# Patient Record
Sex: Female | Born: 1950 | Race: White | Hispanic: No | Smoking: Former smoker
Health system: Southern US, Community
[De-identification: ages and names within clinical notes are randomized; demographics above are authoritative.]

## PROBLEM LIST (undated history)

## (undated) DIAGNOSIS — F32A Depression, unspecified: Secondary | ICD-10-CM

## (undated) DIAGNOSIS — D696 Thrombocytopenia, unspecified: Secondary | ICD-10-CM

## (undated) DIAGNOSIS — I10 Essential (primary) hypertension: Secondary | ICD-10-CM

## (undated) DIAGNOSIS — F329 Major depressive disorder, single episode, unspecified: Secondary | ICD-10-CM

## (undated) DIAGNOSIS — J449 Chronic obstructive pulmonary disease, unspecified: Secondary | ICD-10-CM

## (undated) DIAGNOSIS — T7840XA Allergy, unspecified, initial encounter: Secondary | ICD-10-CM

## (undated) HISTORY — DX: Depression, unspecified: F32.A

## (undated) HISTORY — DX: Essential (primary) hypertension: I10

## (undated) HISTORY — DX: Major depressive disorder, single episode, unspecified: F32.9

## (undated) HISTORY — DX: Allergy, unspecified, initial encounter: T78.40XA

## (undated) HISTORY — PX: CARPAL TUNNEL RELEASE: SHX101

---
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
## 2008-06-30 ENCOUNTER — Emergency Department: Payer: Self-pay | Admitting: Unknown Physician Specialty

## 2012-02-13 ENCOUNTER — Ambulatory Visit: Payer: Self-pay

## 2012-02-20 ENCOUNTER — Ambulatory Visit: Payer: Self-pay

## 2015-05-21 ENCOUNTER — Other Ambulatory Visit: Payer: Self-pay | Admitting: Internal Medicine

## 2015-05-21 DIAGNOSIS — Z1231 Encounter for screening mammogram for malignant neoplasm of breast: Secondary | ICD-10-CM

## 2015-06-01 ENCOUNTER — Ambulatory Visit
Admission: RE | Admit: 2015-06-01 | Discharge: 2015-06-01 | Disposition: A | Discharge status: Home / Self Care | Payer: BLUE CROSS/BLUE SHIELD | Source: Ambulatory Visit | Attending: Internal Medicine | Admitting: Internal Medicine

## 2015-06-01 DIAGNOSIS — Z1231 Encounter for screening mammogram for malignant neoplasm of breast: Secondary | ICD-10-CM | POA: Diagnosis not present

## 2015-06-19 ENCOUNTER — Inpatient Hospital Stay: Payer: BLUE CROSS/BLUE SHIELD

## 2015-06-19 ENCOUNTER — Inpatient Hospital Stay: Payer: BLUE CROSS/BLUE SHIELD | Attending: Oncology | Admitting: Oncology

## 2015-06-19 ENCOUNTER — Encounter: Payer: Self-pay | Admitting: Oncology

## 2015-06-19 VITALS — BP 122/81 | HR 76 | Temp 98.2°F | Ht 61.0 in | Wt 105.9 lb

## 2015-06-19 DIAGNOSIS — I959 Hypotension, unspecified: Secondary | ICD-10-CM | POA: Insufficient documentation

## 2015-06-19 DIAGNOSIS — Z87891 Personal history of nicotine dependence: Secondary | ICD-10-CM

## 2015-06-19 DIAGNOSIS — F329 Major depressive disorder, single episode, unspecified: Secondary | ICD-10-CM | POA: Insufficient documentation

## 2015-06-19 DIAGNOSIS — Z803 Family history of malignant neoplasm of breast: Secondary | ICD-10-CM | POA: Insufficient documentation

## 2015-06-19 DIAGNOSIS — Z79899 Other long term (current) drug therapy: Secondary | ICD-10-CM | POA: Diagnosis not present

## 2015-06-19 DIAGNOSIS — D693 Immune thrombocytopenic purpura: Secondary | ICD-10-CM | POA: Insufficient documentation

## 2015-06-19 DIAGNOSIS — I10 Essential (primary) hypertension: Secondary | ICD-10-CM | POA: Diagnosis not present

## 2015-06-19 LAB — IRON AND TIBC
IRON: 70 ug/dL (ref 28–170)
Saturation Ratios: 19 % (ref 10.4–31.8)
TIBC: 364 ug/dL (ref 250–450)
UIBC: 294 ug/dL

## 2015-06-19 LAB — APTT: aPTT: 29 seconds (ref 24–36)

## 2015-06-19 LAB — CBC
HCT: 43.8 % (ref 35.0–47.0)
HEMOGLOBIN: 14.4 g/dL (ref 12.0–16.0)
MCH: 30.7 pg (ref 26.0–34.0)
MCHC: 33 g/dL (ref 32.0–36.0)
MCV: 93.1 fL (ref 80.0–100.0)
Platelets: 30 10*3/uL — ABNORMAL LOW (ref 150–440)
RBC: 4.71 MIL/uL (ref 3.80–5.20)
RDW: 14 % (ref 11.5–14.5)
WBC: 9.5 10*3/uL (ref 3.6–11.0)

## 2015-06-19 LAB — VITAMIN B12: Vitamin B-12: 231 pg/mL (ref 180–914)

## 2015-06-19 LAB — FOLATE: Folate: 9.4 ng/mL (ref >5.9)

## 2015-06-19 LAB — FERRITIN: FERRITIN: 52 ng/mL (ref 11–307)

## 2015-06-19 LAB — LACTATE DEHYDROGENASE: LDH: 168 U/L (ref 98–192)

## 2015-06-20 LAB — PLATELET ANTIBODY PROFILE, SERUM
HLA AB SER QL EIA: NEGATIVE
IA/IIA Antibody: NEGATIVE
IB/IX Antibody: NEGATIVE
IIB/IIIA Antibody: NEGATIVE

## 2015-06-20 LAB — ENA+DNA/DS+SJORGEN'S
ENA SM Ab Ser-aCnc: <0.2 AI (ref 0.0–0.9)
RIBONUCLEIC PROTEIN: 1.2 AI — AB (ref 0.0–0.9)
SSA (Ro) (ENA) Antibody, IgG: <0.2 AI (ref 0.0–0.9)
SSB (La) (ENA) Antibody, IgG: <0.2 AI (ref 0.0–0.9)

## 2015-06-20 LAB — ANA W/REFLEX: ANA: POSITIVE — AB

## 2015-06-20 LAB — HAPTOGLOBIN: HAPTOGLOBIN: 92 mg/dL (ref 34–200)

## 2015-06-22 LAB — PROTEIN ELECTROPHORESIS, SERUM
A/G Ratio: 1.5 (ref 0.7–1.7)
ALPHA-1-GLOBULIN: 0.2 g/dL (ref 0.0–0.4)
ALPHA-2-GLOBULIN: 0.6 g/dL (ref 0.4–1.0)
Albumin ELP: 3.9 g/dL (ref 2.9–4.4)
Beta Globulin: 0.9 g/dL (ref 0.7–1.3)
GLOBULIN, TOTAL: 2.6 g/dL (ref 2.2–3.9)
Gamma Globulin: 0.9 g/dL (ref 0.4–1.8)
Total Protein ELP: 6.5 g/dL (ref 6.0–8.5)

## 2015-06-28 NOTE — Progress Notes (Signed)
[place]  Telephone:(336) [phone] [phone]) [phone]  ID: [name] OB: [birth year]  MR#: [id number]  HYW#:[id number]  No care team member to display  CHIEF COMPLAINT:  Chief Complaint  Patient presents with  . ITP  . 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 She currently feels well and is asymptomatic. She denies any easy bleeding or bruising. She has a good appetite and denies weight loss. She has no neurological point. She denies any recent fevers or illnesses. She has no chest pain or shortness of breath. She denies any nausea, vomiting, constipation, or diarrhea. She has no urinary complaints. Patient feels at her baseline and offers no specific complaints today.  REVIEW OF SYSTEMS:   Review of Systems  Constitutional: Negative.  Negative for fever, weight loss and malaise/fatigue.  Respiratory: Negative.  Negative for cough, hemoptysis and shortness of breath.   Cardiovascular: Negative.  Negative for chest pain.  Gastrointestinal: Negative.  Negative for abdominal pain and melena.  Genitourinary: Negative.  Negative for hematuria.  Musculoskeletal: Negative.   Neurological: Negative.  Negative for weakness.  Endo/Heme/Allergies: Does not bruise/bleed easily.  Psychiatric/Behavioral: Negative.     As per HPI. Otherwise, a complete review of systems is negatve.  PAST MEDICAL HISTORY: Past Medical History  Diagnosis Date  . Allergy   . Hypertension   . Depression     PAST SURGICAL HISTORY: Past Surgical History  Procedure Laterality Date  . Tonsillectomy Bilateral 1968    FAMILY HISTORY Family History  Problem Relation Age of Onset  . Breast cancer Mother     70's  . Breast cancer Daughter     43's   . Clotting disorder Daughter        ADVANCED DIRECTIVES:    HEALTH MAINTENANCE: Social History  Substance Use Topics  .  Smoking status: Former Smoker    Quit date: 04/19/2014  . Smokeless tobacco: None  . Alcohol Use: Yes     Colonoscopy:  PAP:  Bone density:  Lipid panel:  No Known Allergies  Current Outpatient Prescriptions  Medication Sig Dispense Refill  . cetirizine (ZYRTEC) 10 MG tablet Take 10 mg by mouth daily.    Marland Kitchen FLUoxetine (PROZAC) 40 MG capsule   2  . gabapentin (NEURONTIN) 400 MG capsule   2  . hydrochlorothiazide (HYDRODIURIL) 25 MG tablet   2  . traZODone (DESYREL) 50 MG tablet   2   No current facility-administered medications for this visit.    OBJECTIVE: Filed Vitals:   06/19/15 1056  BP: 122/81  Pulse: 76  Temp: 98.2 F (36.8 C)     Body mass index is 20.03 kg/(m^2).    ECOG FS:0 - Asymptomatic  General: Well-developed, well-nourished, no acute distress. Eyes: Pink conjunctiva, anicteric sclera. HEENT: Normocephalic, moist mucous membranes, clear oropharnyx. Lungs: Clear to auscultation bilaterally. Heart: Regular rate and rhythm. No rubs, murmurs, or gallops. Abdomen: Soft, nontender, nondistended. No organomegaly noted, normoactive bowel sounds. Musculoskeletal: No edema, cyanosis, or clubbing. Neuro: Alert, answering all questions appropriately. Cranial nerves grossly intact. Skin: No rashes or petechiae noted. Psych: Normal affect. Lymphatics: No cervical, calvicular, axillary or inguinal LAD.   LAB RESULTS:  No results found for: NA, K, CL, CO2, GLUCOSE, BUN, CREATININE, CALCIUM, PROT, ALBUMIN, AST, ALT, ALKPHOS, BILITOT, GFRNONAA, GFRAA  Lab Results  Component Value Date   WBC 9.5 06/19/2015   HGB 14.4 06/19/2015  HCT 43.8 06/19/2015   MCV 93.1 06/19/2015   PLT 30* 06/19/2015     STUDIES: Mm Digital Screening Bilateral  06/01/2015  CLINICAL DATA:  Screening. EXAM: DIGITAL SCREENING BILATERAL MAMMOGRAM WITH CAD COMPARISON:  None. ACR Breast Density Category b: There are scattered areas of fibroglandular density. FINDINGS: There are no findings  suspicious for malignancy. Images were processed with CAD. IMPRESSION: No mammographic evidence of malignancy. A result letter of this screening mammogram will be mailed directly to the patient. RECOMMENDATION: Screening mammogram in one year. (Code:SM-B-01Y) BI-RADS CATEGORY  1: Negative. Electronically Signed   By: Nolon Nations M.D.   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  PLAN:    1. ITP: All of patient's laboratory work today other than her platelets are within normal limits. Patient also reports a normal bone marrow biopsy in the past. We will consider repeating bone marrow biopsy to confirm diagnosis. Patient's white count is 30 and she will possibly benefit from treatment in the near future. Return to clinic in 3 weeks with repeat laboratory work and further evaluation. 2. Family history breast cancer: Prior 48, patient's mother had breast cancer in her 53s and her daughter had breast cancer in her 57s. We will consider genetic testing in the future.  Approximately 45 minutes was spent in discussion of which greater than 50% was consultation.  Patient expressed understanding and was in agreement with this plan. She also understands that She can call clinic at any time with any questions, concerns, or complaints.    Lloyd Huger, MD   06/28/2015 9:07 AM

## 2015-07-05 ENCOUNTER — Encounter: Payer: Self-pay | Admitting: Medical Oncology

## 2015-07-05 ENCOUNTER — Emergency Department: Payer: BLUE CROSS/BLUE SHIELD

## 2015-07-05 ENCOUNTER — Emergency Department
Admission: EM | Admit: 2015-07-05 | Discharge: 2015-07-05 | Disposition: A | Discharge status: Home / Self Care | Payer: BLUE CROSS/BLUE SHIELD | Attending: Emergency Medicine | Admitting: Emergency Medicine

## 2015-07-05 DIAGNOSIS — S8261XA Displaced fracture of lateral malleolus of right fibula, initial encounter for closed fracture: Secondary | ICD-10-CM | POA: Diagnosis not present

## 2015-07-05 DIAGNOSIS — Z87891 Personal history of nicotine dependence: Secondary | ICD-10-CM | POA: Diagnosis not present

## 2015-07-05 DIAGNOSIS — W010XXA Fall on same level from slipping, tripping and stumbling without subsequent striking against object, initial encounter: Secondary | ICD-10-CM | POA: Diagnosis not present

## 2015-07-05 DIAGNOSIS — S8254XA Nondisplaced fracture of medial malleolus of right tibia, initial encounter for closed fracture: Secondary | ICD-10-CM | POA: Diagnosis not present

## 2015-07-05 DIAGNOSIS — Y939 Activity, unspecified: Secondary | ICD-10-CM | POA: Diagnosis not present

## 2015-07-05 DIAGNOSIS — Y999 Unspecified external cause status: Secondary | ICD-10-CM | POA: Diagnosis not present

## 2015-07-05 DIAGNOSIS — X501XXA Overexertion from prolonged static or awkward postures, initial encounter: Secondary | ICD-10-CM | POA: Insufficient documentation

## 2015-07-05 DIAGNOSIS — Z79899 Other long term (current) drug therapy: Secondary | ICD-10-CM | POA: Diagnosis not present

## 2015-07-05 DIAGNOSIS — I10 Essential (primary) hypertension: Secondary | ICD-10-CM | POA: Insufficient documentation

## 2015-07-05 DIAGNOSIS — F329 Major depressive disorder, single episode, unspecified: Secondary | ICD-10-CM | POA: Insufficient documentation

## 2015-07-05 DIAGNOSIS — S8251XA Displaced fracture of medial malleolus of right tibia, initial encounter for closed fracture: Secondary | ICD-10-CM

## 2015-07-05 DIAGNOSIS — Y929 Unspecified place or not applicable: Secondary | ICD-10-CM | POA: Insufficient documentation

## 2015-07-05 DIAGNOSIS — S8991XA Unspecified injury of right lower leg, initial encounter: Secondary | ICD-10-CM | POA: Diagnosis present

## 2015-07-05 MED ORDER — HYDROCODONE-ACETAMINOPHEN 5-325 MG PO TABS: 1.0000 | ORAL_TABLET | Freq: Four times a day (QID) | ORAL | Status: DC | PRN

## 2015-07-05 MED ORDER — OXYCODONE-ACETAMINOPHEN 5-325 MG PO TABS
1.0000 | ORAL_TABLET | Freq: Once | ORAL | Status: AC
  Administered 2015-07-05: 1 via ORAL
  Filled 2015-07-05: qty 1

## 2015-07-05 NOTE — ED Notes (Signed)
See triage note  States she tripped over cat   Twisted right ankle  Unable to bear full wt.

## 2015-07-05 NOTE — ED Provider Notes (Signed)
[facility] Emergency Department Provider Note  ____________________________________________  Time seen: Approximately 11:33 AM  I have reviewed the triage vital signs and the nursing notes.   HISTORY  Chief Complaint Ankle Pain    HPI Nancy Love is a 65 y.o. female , NAD, presents to the emergency department with right ankle pain after tripping over a cat earlier today. Patient states she has a rescue cat that stays around her feet. Notes that she tripped over the animal early this morning and twisted her right ankle. Has been unable to bear weight about the right lower extremity since that time. Has been using a walker to assist with ambulation. Has noted some swelling and severe pain. Has, weakness, tingling. No previous injuries to her right lower extremity.Denies any open wounds or lacerations. Denies head injury, LOC, dizziness, visual changes. Has had no chest pain, abdominal pain, shortness breath, nausea, vomiting.   Past Medical History  Diagnosis Date  . Allergy   . Hypertension   . Depression     There are no active problems to display for this patient.   Past Surgical History  Procedure Laterality Date  . Tonsillectomy Bilateral 1968    Current Outpatient Rx  Name  Route  Sig  Dispense  Refill  . cetirizine (ZYRTEC) 10 MG tablet   Oral   Take 10 mg by mouth daily.         Marland Kitchen FLUoxetine (PROZAC) 40 MG capsule            2   . gabapentin (NEURONTIN) 400 MG capsule            2   . hydrochlorothiazide (HYDRODIURIL) 25 MG tablet            2   . HYDROcodone-acetaminophen (NORCO) 5-325 MG tablet   Oral   Take 1 tablet by mouth every 6 (six) hours as needed for severe pain.   12 tablet   0   . traZODone (DESYREL) 50 MG tablet            2     Allergies Review of patient's allergies indicates no known allergies.  Family History  Problem Relation Age of Onset  . Breast cancer Mother     34's  . Breast  cancer Daughter     10's   . Clotting disorder Daughter     Social History Social History  Substance Use Topics  . Smoking status: Former Smoker    Quit date: 04/19/2014  . Smokeless tobacco: None  . Alcohol Use: Yes     Review of Systems  Constitutional: No fatigue Eyes: No visual changes.  Cardiovascular: No chest pain. Respiratory:  No shortness of breath. No wheezing.  Gastrointestinal: No abdominal pain.  No nausea, vomiting.  Musculoskeletal: Positive right ankle pain. Skin: Positive swelling and bruising right ankle. Negative for rash, open wounds, lacerations. Neurological: Negative for headaches, focal weakness or numbness. 10-point ROS otherwise negative.  ____________________________________________   PHYSICAL EXAM:  VITAL SIGNS: ED Triage Vitals  Enc Vitals Group     BP 07/05/15 1103 106/66 mmHg     Pulse Rate 07/05/15 1103 88     Resp 07/05/15 1103 17     Temp 07/05/15 1103 98.1 F (36.7 C)     Temp Source 07/05/15 1103 Oral     SpO2 07/05/15 1103 96 %     Weight 07/05/15 1059 105 lb (47.628 kg)     Height --  Head Cir --      Peak Flow --      Pain Score 07/05/15 1059 10     Pain Loc --      Pain Edu? --      Excl. in [place]? --      Constitutional: Alert and oriented. Well appearing and in no acute distress, but in pain. Eyes: Conjunctivae are normal.  Head: Atraumatic. Cardiovascular: Good peripheral circulation with 2+ pulses noted in the right lower extremity. Capillary refill is brisk in the right lower extremity. Respiratory: Normal respiratory effort without tachypnea or retractions.  Musculoskeletal: Patient unable to flex the right foot due to pain in the right ankle. Significant tenderness to palpation about the right lateral malleolus and minimal pain about the right medial malleolus. No tenderness to palpation about the right foot or toes. Full range of motion of all digits of the right foot. No lower extremity edema.  No joint  effusions. Neurologic:  Normal speech and language. No gross focal neurologic deficits are appreciated.  Skin:  Swelling and bruising noted about the right lateral ankle. No open wounds or lacerations. Skin is warm, dry and intact. No rash noted. Psychiatric: Mood and affect are normal. Speech and behavior are normal. Patient exhibits appropriate insight and judgement.   ____________________________________________   LABS  None ____________________________________________  EKG  None ____________________________________________  RADIOLOGY I have personally viewed and evaluated these images (plain radiographs) as part of my medical decision making, as well as reviewing the written report by the radiologist.  Dg Ankle Complete Right  07/05/2015  CLINICAL DATA:  Right ankle pain after injury EXAM: RIGHT ANKLE - COMPLETE 3+ VIEW COMPARISON:  None. FINDINGS: Lateral right ankle soft tissue swelling. Oblique intra-articular right lateral malleolus fracture with 3 mm lateral displacement of the distal fracture fragment. Tiny nondisplaced avulsion fracture fragment at the tip of the medial malleolus. No additional fracture. No subluxation. No suspicious focal osseous lesion. No appreciable degenerative or erosive arthropathy. No radiopaque foreign body. IMPRESSION: 1. Mildly displaced right lateral malleolus fracture. 2. Nondisplaced tiny avulsion fracture at the tip of the medial malleolus. 3. No subluxation. Electronically Signed   By: Ilona Sorrel M.D.   On: 07/05/2015 11:25    ____________________________________________    PROCEDURES  Procedure(s) performed: None    Medications  oxyCODONE-acetaminophen (PERCOCET/ROXICET) 5-325 MG per tablet 1 tablet (1 tablet Oral Given 07/05/15 1144)     ____________________________________________   INITIAL IMPRESSION / ASSESSMENT AND PLAN / ED COURSE  Pertinent imaging results that were available during my care of the patient were reviewed  by me and considered in my medical decision making (see chart for details).  Patient's diagnosis is consistent with fracture of the right lateral and medial malleolus. Patient was placed in a short-leg Ortho-Glass splint. Patient should use the walker she has at home to assist in ambulation. Patient will be discharged home with prescriptions for Norco to use sparingly as needed for severe pain. Patient is to keep the right lower extremity elevated when not ambulating. Patient is to follow up with Dr. Bess Harvest in orthopedics in 2-3 days for further evaluation and treatment. Patient is given ED precautions to return to the ED for any worsening or new symptoms.    ____________________________________________  FINAL CLINICAL IMPRESSION(S) / ED DIAGNOSES  Final diagnoses:  Fracture of right ankle, lateral malleolus, closed, initial encounter  Fracture of medial malleolus, right, closed, initial encounter      NEW MEDICATIONS STARTED DURING THIS VISIT:  New Prescriptions   HYDROCODONE-ACETAMINOPHEN (NORCO) 5-325 MG TABLET    Take 1 tablet by mouth every 6 (six) hours as needed for severe pain.         Braxton Feathers, PA-C 07/05/15 1148  Daymon Larsen, MD 07/05/15 1229

## 2015-07-05 NOTE — Discharge Instructions (Signed)
Ankle Fracture A fracture is a break in a bone. The ankle joint is made up of three bones. These include the lower (distal)sections of your lower leg bones, called the tibia and fibula, along with a bone in your foot, called the talus. Depending on how bad the break is and if more than one ankle joint bone is broken, a cast or splint is used to protect and keep your injured bone from moving while it heals. Sometimes, surgery is required to help the fracture heal properly.  There are two general types of fractures:  Stable fracture. This includes a single fracture line through one bone, with no injury to ankle ligaments. A fracture of the talus that does not have any displacement (movement of the bone on either side of the fracture line) is also stable.  Unstable fracture. This includes more than one fracture line through one or more bones in the ankle joint. It also includes fractures that have displacement of the bone on either side of the fracture line. CAUSES  A direct blow to the ankle.   Quickly and severely twisting your ankle.  Trauma, such as a car accident or falling from a significant height. RISK FACTORS You may be at a higher risk of ankle fracture if:  You have certain medical conditions.  You are involved in high-impact sports.  You are involved in a high-impact car accident. SIGNS AND SYMPTOMS   Tender and swollen ankle.  Bruising around the injured ankle.  Pain on movement of the ankle.  Difficulty walking or putting weight on the ankle.  A cold foot below the site of the ankle injury. This can occur if the blood vessels passing through your injured ankle were also damaged.  Numbness in the foot below the site of the ankle injury. DIAGNOSIS  An ankle fracture is usually diagnosed with a physical exam and X-rays. A CT scan may also be required for complex fractures. TREATMENT  Stable fractures are treated with a cast or splint and using crutches to avoid putting  weight on your injured ankle. This is followed by an ankle strengthening program. Some patients require a special type of cast, depending on other medical problems they may have. Unstable fractures require surgery to ensure the bones heal properly. Your health care provider will tell you what type of fracture you have and the best treatment for your condition. HOME CARE INSTRUCTIONS   Review correct crutch use with your health care provider and use your crutches as directed. Safe use of crutches is extremely important. Misuse of crutches can cause you to fall or cause injury to nerves in your hands or armpits.  Do not put weight or pressure on the injured ankle until directed by your health care provider.  To lessen the swelling, keep the injured leg elevated while sitting or lying down.  Apply ice to the injured area:  Put ice in a plastic bag.  Place a towel between your cast and the bag.  Leave the ice on for 20 minutes, 2-3 times a day.  If you have a plaster or fiberglass cast:  Do not try to scratch the skin under the cast with any objects. This can increase your risk of skin infection.  Check the skin around the cast every day. You may put lotion on any red or sore areas.  Keep your cast dry and clean.  If you have a plaster splint:  Wear the splint as directed.  You may loosen the elastic   around the splint if your toes become numb, tingle, or turn cold or blue.  Do not put pressure on any part of your cast or splint; it may break. Rest your cast only on a pillow the first 24 hours until it is fully hardened.  Your cast or splint can be protected during bathing with a plastic bag sealed to your skin with medical tape. Do not lower the cast or splint into water.  Take medicines as directed by your health care provider. Only take over-the-counter or prescription medicines for pain, discomfort, or fever as directed by your health care provider.  Do not drive a vehicle until  your health care provider specifically tells you it is safe to do so.  If your health care provider has given you a follow-up appointment, it is very important to keep that appointment. Not keeping the appointment could result in a chronic or permanent injury, pain, and disability. If you have any problem keeping the appointment, call the facility for assistance. SEEK MEDICAL CARE IF: You develop increased swelling or discomfort. SEEK IMMEDIATE MEDICAL CARE IF:   Your cast gets damaged or breaks.  You have continued severe pain.  You develop new pain or swelling after the cast was put on.  Your skin or toenails below the injury turn blue or gray.  Your skin or toenails below the injury feel cold, numb, or have loss of sensitivity to touch.  There is a bad smell or pus draining from under the cast. MAKE SURE YOU:   Understand these instructions.  Will watch your condition.  Will get help right away if you are not doing well or get worse.   This information is not intended to replace advice given to you by your health care provider. Make sure you discuss any questions you have with your health care provider.   Document Released: 12/25/1999 Document Revised: 01/01/2013 Document Reviewed: 07/26/2012 Elsevier Interactive Patient Education 2016 [employer]  

## 2015-07-05 NOTE — ED Notes (Signed)
Pt tripped over her cat and twisted rt ankle

## 2015-07-10 ENCOUNTER — Other Ambulatory Visit: Payer: Self-pay | Admitting: *Deleted

## 2015-07-10 ENCOUNTER — Inpatient Hospital Stay (HOSPITAL_BASED_OUTPATIENT_CLINIC_OR_DEPARTMENT_OTHER): Payer: BLUE CROSS/BLUE SHIELD | Admitting: Oncology

## 2015-07-10 ENCOUNTER — Inpatient Hospital Stay: Payer: BLUE CROSS/BLUE SHIELD

## 2015-07-10 VITALS — BP 90/59 | HR 80 | Temp 98.0°F | Resp 18 | Wt 106.6 lb

## 2015-07-10 DIAGNOSIS — D696 Thrombocytopenia, unspecified: Secondary | ICD-10-CM

## 2015-07-10 DIAGNOSIS — I959 Hypotension, unspecified: Secondary | ICD-10-CM

## 2015-07-10 DIAGNOSIS — Z79899 Other long term (current) drug therapy: Secondary | ICD-10-CM

## 2015-07-10 DIAGNOSIS — D693 Immune thrombocytopenic purpura: Secondary | ICD-10-CM

## 2015-07-10 DIAGNOSIS — Z87891 Personal history of nicotine dependence: Secondary | ICD-10-CM | POA: Diagnosis not present

## 2015-07-10 DIAGNOSIS — F329 Major depressive disorder, single episode, unspecified: Secondary | ICD-10-CM

## 2015-07-10 DIAGNOSIS — Z803 Family history of malignant neoplasm of breast: Secondary | ICD-10-CM

## 2015-07-10 NOTE — Progress Notes (Signed)
States recently broke right ankle in 2 places. Complains of pain in right ankle. Finished Norco prescription and is taking OTC pain meds.

## 2015-07-10 NOTE — Progress Notes (Signed)
[place]  Telephone:(336) [phone] Fax:(336) [phone]  ID: Nancy Love OB: Jan 06, 1951  MR#: [id number]  VZD#:[id number]  Patient Care Team: Ellamae Sia, MD as PCP - General (Internal Medicine)  CHIEF COMPLAINT:  Chief Complaint  Patient presents with  . thrombocytopenia    INTERVAL HISTORY: Patient Returns to clinic today for further evaluation and discussion of her laboratory work. She recently broke her right ankle in 2 places after tripping over her cat. She has some leg pain, but otherwise feels well. She denies any easy bleeding or bruising. She has a good appetite and denies weight loss. She has no neurological complaints. She denies any recent fevers or illnesses. She has no chest pain or shortness of breath. She denies any nausea, vomiting, constipation, or diarrhea. She has no urinary complaints. Patient otherwise feels well and offers no further specific complaints today.  REVIEW OF SYSTEMS:   Review of Systems  Constitutional: Negative.  Negative for fever, weight loss and malaise/fatigue.  Respiratory: Negative.  Negative for cough, hemoptysis and shortness of breath.   Cardiovascular: Negative.  Negative for chest pain.  Gastrointestinal: Negative.  Negative for abdominal pain and melena.  Genitourinary: Negative.  Negative for hematuria.  Musculoskeletal: Positive for falls.       Right leg pain secondary to fractured ankle.  Neurological: Negative.  Negative for weakness.  Endo/Heme/Allergies: Does not bruise/bleed easily.  Psychiatric/Behavioral: Negative.     As per HPI. Otherwise, a complete review of systems is negatve.  PAST MEDICAL HISTORY: Past Medical History  Diagnosis Date  . Allergy   . Hypertension   . Depression     PAST SURGICAL HISTORY: Past Surgical History  Procedure Laterality Date  . Tonsillectomy Bilateral 1968    FAMILY HISTORY Family History  Problem Relation Age of Onset  . Breast cancer Mother    25's  . Breast cancer Daughter     48's   . Clotting disorder Daughter        ADVANCED DIRECTIVES:    HEALTH MAINTENANCE: Social History  Substance Use Topics  . Smoking status: Former Smoker    Quit date: 04/19/2014  . Smokeless tobacco: Not on file  . Alcohol Use: Yes     Colonoscopy:  PAP:  Bone density:  Lipid panel:  No Known Allergies  Current Outpatient Prescriptions  Medication Sig Dispense Refill  . cetirizine (ZYRTEC) 10 MG tablet Take 10 mg by mouth daily.    Marland Kitchen FLUoxetine (PROZAC) 40 MG capsule   2  . gabapentin (NEURONTIN) 400 MG capsule   2  . traZODone (DESYREL) 50 MG tablet   2  . hydrochlorothiazide (HYDRODIURIL) 25 MG tablet   2  . HYDROcodone-acetaminophen (NORCO) 5-325 MG tablet Take 1 tablet by mouth every 6 (six) hours as needed for severe pain. (Patient not taking: Reported on 07/10/2015) 12 tablet 0   No current facility-administered medications for this visit.    OBJECTIVE: Filed Vitals:   07/10/15 1140  BP: 90/59  Pulse: 80  Temp: 98 F (36.7 C)  Resp: 18     Body mass index is 20.15 kg/(m^2).    ECOG FS:0 - Asymptomatic  General: Well-developed, well-nourished, no acute distress. Eyes: Pink conjunctiva, anicteric sclera. Lungs: Clear to auscultation bilaterally. Heart: Regular rate and rhythm. No rubs, murmurs, or gallops. Abdomen: Soft, nontender, nondistended. No organomegaly noted, normoactive bowel sounds. Musculoskeletal: No edema, cyanosis, or clubbing. Right leg and cast up to just below the knee. Neuro: Alert, answering all questions  appropriately. Cranial nerves grossly intact. Skin: No rashes or petechiae noted. Psych: Normal affect.   LAB RESULTS:  No results found for: NA, K, CL, CO2, GLUCOSE, BUN, CREATININE, CALCIUM, PROT, ALBUMIN, AST, ALT, ALKPHOS, BILITOT, GFRNONAA, GFRAA  Lab Results  Component Value Date   WBC 9.5 06/19/2015   HGB 14.4 06/19/2015   HCT 43.8 06/19/2015   MCV 93.1 06/19/2015   PLT 30*  06/19/2015     STUDIES: Dg Ankle Complete Right  07/05/2015  CLINICAL DATA:  Right ankle pain after injury EXAM: RIGHT ANKLE - COMPLETE 3+ VIEW COMPARISON:  None. FINDINGS: Lateral right ankle soft tissue swelling. Oblique intra-articular right lateral malleolus fracture with 3 mm lateral displacement of the distal fracture fragment. Tiny nondisplaced avulsion fracture fragment at the tip of the medial malleolus. No additional fracture. No subluxation. No suspicious focal osseous lesion. No appreciable degenerative or erosive arthropathy. No radiopaque foreign body. IMPRESSION: 1. Mildly displaced right lateral malleolus fracture. 2. Nondisplaced tiny avulsion fracture at the tip of the medial malleolus. 3. No subluxation. Electronically Signed   By: Ilona Sorrel M.D.   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  PLAN:    1. ITP: All of patient's laboratory work today other than her platelets are within normal limits. Patient states she was seen [facility] several years ago with a baseline platelet count of approximately 50, but denies ever having a bone marrow biopsy. After lengthy discussion with the patient, she agrees to pursue bone marrow biopsy in the next 1-2 weeks. If patient requires treatment for surgery for her broken ankle, can try pulse dose prednisone to see if her platelet count improves. Otherwise, we will await bone marrow biopsy results. Return to clinic one week after her biopsy for further evaluation. 2. Broken ankle: Patient has appointment with orthopedics on Monday, July 13, 2015. Will treat platelets as above if surgery is needed.  3. Family history breast cancer: By report, patient's mother had breast cancer in her 9s and her daughter had breast cancer in her 69s. Will consider genetic testing in the future.  4. Hypotension: Patient was instructed to monitor her blood pressure closely at home. Results were faxed to her primary care  physician.  Approximately 30 minutes was spent in discussion of which greater than 50% was consultation.  Patient expressed understanding and was in agreement with this plan. She also understands that She can call clinic at any time with any questions, concerns, or complaints.    Lloyd Huger, MD   07/10/2015 9:05 PM

## 2015-07-18 DIAGNOSIS — D693 Immune thrombocytopenic purpura: Secondary | ICD-10-CM | POA: Insufficient documentation

## 2015-07-20 ENCOUNTER — Other Ambulatory Visit: Payer: Self-pay | Admitting: Radiology

## 2015-07-21 ENCOUNTER — Ambulatory Visit: Admission: RE | Admit: 2015-07-21 | Payer: BLUE CROSS/BLUE SHIELD | Source: Ambulatory Visit

## 2015-07-21 ENCOUNTER — Ambulatory Visit
Admission: RE | Admit: 2015-07-21 | Discharge: 2015-07-21 | Disposition: A | Discharge status: Home / Self Care | Payer: BLUE CROSS/BLUE SHIELD | Source: Ambulatory Visit | Attending: Oncology | Admitting: Oncology

## 2015-07-21 DIAGNOSIS — D696 Thrombocytopenia, unspecified: Secondary | ICD-10-CM | POA: Diagnosis not present

## 2015-07-21 LAB — CBC
HEMATOCRIT: 42.5 % (ref 35.0–47.0)
HEMOGLOBIN: 14.1 g/dL (ref 12.0–16.0)
MCH: 31.6 pg (ref 26.0–34.0)
MCHC: 33.2 g/dL (ref 32.0–36.0)
MCV: 95.1 fL (ref 80.0–100.0)
Platelets: 31 10*3/uL — ABNORMAL LOW (ref 150–440)
RBC: 4.47 MIL/uL (ref 3.80–5.20)
RDW: 13.6 % (ref 11.5–14.5)
WBC: 7.3 10*3/uL (ref 3.6–11.0)

## 2015-07-21 LAB — APTT: aPTT: 30 seconds (ref 24–36)

## 2015-07-21 LAB — DIFFERENTIAL
Basophils Absolute: 0 10*3/uL (ref 0–0.1)
Basophils Relative: 1 %
EOS PCT: 4 %
Eosinophils Absolute: 0.3 10*3/uL (ref 0–0.7)
LYMPHS ABS: 1.4 10*3/uL (ref 1.0–3.6)
LYMPHS PCT: 18 %
MONO ABS: 0.4 10*3/uL (ref 0.2–0.9)
MONOS PCT: 5 %
NEUTROS ABS: 5.4 10*3/uL (ref 1.4–6.5)
Neutrophils Relative %: 72 %

## 2015-07-21 LAB — PROTIME-INR
INR: 1.03
Prothrombin Time: 13.7 seconds (ref 11.4–15.0)

## 2015-07-21 MED ORDER — HYDROCODONE-ACETAMINOPHEN 5-325 MG PO TABS: 1.0000 | ORAL_TABLET | ORAL | Status: DC | PRN

## 2015-07-21 MED ORDER — SODIUM CHLORIDE 0.9 % IV SOLN
Freq: Once | INTRAVENOUS | Status: AC
  Administered 2015-07-21: 13:00:00 via INTRAVENOUS

## 2015-07-21 MED ORDER — FENTANYL CITRATE (PF) 100 MCG/2ML IJ SOLN
INTRAMUSCULAR | Status: AC | PRN
  Administered 2015-07-21 (×2): 50 ug via INTRAVENOUS

## 2015-07-21 MED ORDER — MIDAZOLAM HCL 2 MG/2ML IJ SOLN
INTRAMUSCULAR | Status: AC | PRN
  Administered 2015-07-21 (×2): 1 mg via INTRAVENOUS

## 2015-07-21 NOTE — OR Nursing (Signed)
Dr green notified that bp low, and pt reported having small amount of grape juice at 0730.

## 2015-07-21 NOTE — Procedures (Signed)
Under CT guidance, bone marrow aspiration and core biopsy of right iliac bone was performed.

## 2015-07-21 NOTE — Procedures (Signed)
Procedure and risks discussed with patient. Informed consent obtained. Will perform CT-guided bone marrow biopsy. 

## 2015-07-21 NOTE — Discharge Instructions (Signed)
Bone Marrow Aspiration and Bone Marrow Biopsy, Care After Refer to this sheet in the next few weeks. These instructions provide you with information about caring for yourself after your procedure. Your health care provider may also give you more specific instructions. Your treatment has been planned according to current medical practices, but problems sometimes occur. Call your health care provider if you have any problems or questions after your procedure. WHAT TO EXPECT AFTER THE PROCEDURE After your procedure, it is common to have:  Soreness or tenderness around the puncture site.  Bruising. HOME CARE INSTRUCTIONS  Take medicines only as directed by your health care provider.  Follow your health care provider's instructions about:  Puncture site care.  Bandage (dressing) changes and removal.  Bathe and shower as directed by your health care provider.  Check your puncture site every day for signs of infection. Watch for:  Redness, swelling, or pain.  Fluid, blood, or pus.  Return to your normal activities as directed by your health care provider.  Keep all follow-up visits as directed by your health care provider. This is important. SEEK MEDICAL CARE IF:  You have a fever.  You have uncontrollable bleeding.  You have redness, swelling, or pain at the site of your puncture.  You have fluid, blood, or pus coming from your puncture site.   This information is not intended to replace advice given to you by your health care provider. Make sure you discuss any questions you have with your health care provider.   Document Released: 07/16/2004 Document Revised: 05/13/2014 Document Reviewed: 12/18/2013 Elsevier Interactive Patient Education [employer].

## 2015-07-28 ENCOUNTER — Inpatient Hospital Stay: Payer: BLUE CROSS/BLUE SHIELD | Attending: Oncology | Admitting: Oncology

## 2015-07-28 VITALS — BP 137/82 | HR 68 | Temp 97.2°F | Resp 18 | Wt 103.9 lb

## 2015-07-28 DIAGNOSIS — Z7952 Long term (current) use of systemic steroids: Secondary | ICD-10-CM

## 2015-07-28 DIAGNOSIS — F329 Major depressive disorder, single episode, unspecified: Secondary | ICD-10-CM

## 2015-07-28 DIAGNOSIS — Z87891 Personal history of nicotine dependence: Secondary | ICD-10-CM

## 2015-07-28 DIAGNOSIS — Z9181 History of falling: Secondary | ICD-10-CM

## 2015-07-28 DIAGNOSIS — Z8781 Personal history of (healed) traumatic fracture: Secondary | ICD-10-CM | POA: Diagnosis not present

## 2015-07-28 DIAGNOSIS — Z79899 Other long term (current) drug therapy: Secondary | ICD-10-CM | POA: Diagnosis not present

## 2015-07-28 DIAGNOSIS — M25571 Pain in right ankle and joints of right foot: Secondary | ICD-10-CM

## 2015-07-28 DIAGNOSIS — I10 Essential (primary) hypertension: Secondary | ICD-10-CM

## 2015-07-28 DIAGNOSIS — D693 Immune thrombocytopenic purpura: Secondary | ICD-10-CM | POA: Diagnosis not present

## 2015-07-28 DIAGNOSIS — G4700 Insomnia, unspecified: Secondary | ICD-10-CM | POA: Insufficient documentation

## 2015-07-28 DIAGNOSIS — Z803 Family history of malignant neoplasm of breast: Secondary | ICD-10-CM

## 2015-07-28 MED ORDER — PREDNISONE 50 MG PO TABS: ORAL_TABLET | ORAL | Status: DC

## 2015-07-28 NOTE — Progress Notes (Signed)
Here for bone marrow biopsy results. Complains of pain in right ankle due to recent injury.

## 2015-07-28 NOTE — Progress Notes (Signed)
[facility]  Telephone:([phone] [phone]  ID: Nancy Love OB: May 02, 1950  MR#: [id number]  RCV#:[id number]  Patient Care Team: Ellamae Sia, MD as PCP - General (Internal Medicine)  CHIEF COMPLAINT: ITP  INTERVAL HISTORY: Patient returns to clinic today for further evaluation and discussion of her bone marrow biopsy results. She continues to have right ankle pain secondary to a recent fracture, but otherwise feels well. She denies any easy bleeding or bruising. She has a good appetite and denies weight loss. She has no neurological complaints. She denies any recent fevers or illnesses. She has no chest pain or shortness of breath. She denies any nausea, vomiting, constipation, or diarrhea. She has no urinary complaints. Patient offers no further specific complaints today.  REVIEW OF SYSTEMS:   Review of Systems  Constitutional: Negative.  Negative for fever, weight loss and malaise/fatigue.  Respiratory: Negative.  Negative for cough, hemoptysis and shortness of breath.   Cardiovascular: Negative.  Negative for chest pain.  Gastrointestinal: Negative.  Negative for abdominal pain and melena.  Genitourinary: Negative.  Negative for hematuria.  Musculoskeletal: Positive for falls.       Right leg pain secondary to fractured ankle.  Neurological: Negative.  Negative for weakness.  Endo/Heme/Allergies: Does not bruise/bleed easily.  Psychiatric/Behavioral: Negative.     As per HPI. Otherwise, a complete review of systems is negatve.  PAST MEDICAL HISTORY: Past Medical History:  Diagnosis Date  . Allergy   . Depression   . Hypertension     PAST SURGICAL HISTORY: Past Surgical History:  Procedure Laterality Date  . CARPAL TUNNEL RELEASE    . TONSILLECTOMY Bilateral 1968    FAMILY HISTORY Family History  Problem Relation Age of Onset  . Breast cancer Mother     75's  . Breast cancer Daughter     33's   . Clotting disorder Daughter         ADVANCED DIRECTIVES:    HEALTH MAINTENANCE: Social History  Substance Use Topics  . Smoking status: Former Smoker    Quit date: 04/19/2014  . Smokeless tobacco: Not on file  . Alcohol use Yes     Colonoscopy:  PAP:  Bone density:  Lipid panel:  No Known Allergies  Current Outpatient Prescriptions  Medication Sig Dispense Refill  . albuterol (PROAIR HFA) 108 (90 Base) MCG/ACT inhaler Inhale 1 puff into the lungs every 4 (four) hours as needed.    . cetirizine (ZYRTEC) 10 MG tablet Take 10 mg by mouth daily.    Marland Kitchen FLUoxetine (PROZAC) 40 MG capsule Take 80 mg by mouth daily.   2  . gabapentin (NEURONTIN) 400 MG capsule Take 400 mg by mouth 2 (two) times daily.   2  . hydrochlorothiazide (HYDRODIURIL) 25 MG tablet Reported on 07/21/2015  2  . nitroGLYCERIN (NITROSTAT) 0.4 MG SL tablet Place 1 tablet under the tongue every 5 (five) minutes as needed.    Marland Kitchen oxycodone (OXY-IR) 5 MG capsule Take 5 mg by mouth every 4 (four) hours as needed.    . traZODone (DESYREL) 50 MG tablet Take 50 mg by mouth at bedtime as needed.   2  . predniSONE (DELTASONE) 50 MG tablet Take 1 tablet (50 mg) by mouth daily. 7 tablet 0   No current facility-administered medications for this visit.     OBJECTIVE: Vitals:   07/28/15 1132  BP: 137/82  Pulse: 68  Resp: 18  Temp: 97.2 F (36.2 C)     Body mass  index is 19.64 kg/m.    ECOG FS:0 - Asymptomatic  General: Well-developed, well-nourished, no acute distress. Eyes: Pink conjunctiva, anicteric sclera. Lungs: Clear to auscultation bilaterally. Heart: Regular rate and rhythm. No rubs, murmurs, or gallops. Abdomen: Soft, nontender, nondistended. No organomegaly noted, normoactive bowel sounds. Musculoskeletal: No edema, cyanosis, or clubbing. Right leg and cast up to just below the knee. Neuro: Alert, answering all questions appropriately. Cranial nerves grossly intact. Skin: No rashes or petechiae noted. Psych: Normal affect.   LAB  RESULTS:  No results found for: NA, K, CL, CO2, GLUCOSE, BUN, CREATININE, CALCIUM, PROT, ALBUMIN, AST, ALT, ALKPHOS, BILITOT, GFRNONAA, GFRAA  Lab Results  Component Value Date   WBC 7.3 07/21/2015   NEUTROABS 5.4 07/21/2015   HGB 14.1 07/21/2015   HCT 42.5 07/21/2015   MCV 95.1 07/21/2015   PLT 31 (L) 07/21/2015     STUDIES: Dg Ankle Complete Right  Result Date: 07/05/2015 CLINICAL DATA:  Right ankle pain after injury EXAM: RIGHT ANKLE - COMPLETE 3+ VIEW COMPARISON:  None. FINDINGS: Lateral right ankle soft tissue swelling. Oblique intra-articular right lateral malleolus fracture with 3 mm lateral displacement of the distal fracture fragment. Tiny nondisplaced avulsion fracture fragment at the tip of the medial malleolus. No additional fracture. No subluxation. No suspicious focal osseous lesion. No appreciable degenerative or erosive arthropathy. No radiopaque foreign body. IMPRESSION: 1. Mildly displaced right lateral malleolus fracture. 2. Nondisplaced tiny avulsion fracture at the tip of the medial malleolus. 3. No subluxation. Electronically Signed   By: Ilona Sorrel M.D.   On: 07/05/2015 11:25   Ct Biopsy  Result Date: 07/21/2015 INDICATION: Thrombocytopenia. EXAM: CT BIOPSY MEDICATIONS: None. ANESTHESIA/SEDATION: Moderate (conscious) sedation was employed during this procedure. A total of Versed 2.0 mg and Fentanyl 100 mcg was administered intravenously. Moderate Sedation Time: 35 minutes. The patient's level of consciousness and vital signs were monitored continuously by radiology nursing throughout the procedure under my direct supervision. COMPLICATIONS: None immediate. PROCEDURE: Informed written consent was obtained from the patient after a thorough discussion of the procedural risks, benefits and alternatives. All questions were addressed. Sterile Barrier Technique was utilized including mask, sterile gloves, sterile drape, hand hygiene and skin antiseptic. A timeout was performed  prior to the initiation of the procedure. The patient was placed prone on the CT scanner. Posterior flank region was prepped and draped using sterile technique. Local anesthetic was applied. Under CT guidance, 22 gauge spinal needle was directed toward posterior margin of right iliac bone. Local anesthetic was injected subperiosteally. Then, needle was removed. Also, under CT guidance, trocar was directed through posterior margin of right iliac bone. Bone marrow aspirate was obtained with and without heparin and given to the pathology technologist who was present. Then, bone marrow core biopsy was obtained and also given to the pathology technologist. Needle was removed and appropriate dressing was applied. IMPRESSION: Under CT guidance, successful percutaneous bone marrow aspiration and core biopsy of right iliac bone was performed. Electronically Signed   By: Marijo Conception, M.D.   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 Previously, patient's laboratory work today other than her platelets are also within normal limits. Will initiate patient on a burst of prednisone 1 mg/kg for 7 days. Return to clinic in 1 week for repeat laboratory work and further evaluation. If there is no appreciable change in her platelet count or response is not durable,  will then consider 4 weekly cycles of Rituxan.  2. Broken ankle: Continue treatment per orthopedics. 3. Family history breast cancer: By report, patient's mother had breast cancer in her 54s and her daughter had breast cancer in her 51s. Will consider genetic testing in the future.  4. Hypotension: Patient's blood pressure is within normal limits.  Patient expressed understanding and was in agreement with this plan. She also understands that She can call clinic at any time with any questions, concerns, or complaints.    Lloyd Huger, MD   08/04/2015 12:20 AM

## 2015-07-31 ENCOUNTER — Other Ambulatory Visit: Payer: Self-pay | Admitting: *Deleted

## 2015-07-31 DIAGNOSIS — D693 Immune thrombocytopenic purpura: Secondary | ICD-10-CM

## 2015-08-07 ENCOUNTER — Ambulatory Visit (HOSPITAL_BASED_OUTPATIENT_CLINIC_OR_DEPARTMENT_OTHER): Payer: BLUE CROSS/BLUE SHIELD | Admitting: Oncology

## 2015-08-07 ENCOUNTER — Inpatient Hospital Stay: Payer: BLUE CROSS/BLUE SHIELD

## 2015-08-07 VITALS — BP 134/84 | HR 82 | Temp 99.1°F | Resp 18 | Wt 104.1 lb

## 2015-08-07 DIAGNOSIS — Z79899 Other long term (current) drug therapy: Secondary | ICD-10-CM

## 2015-08-07 DIAGNOSIS — M25571 Pain in right ankle and joints of right foot: Secondary | ICD-10-CM

## 2015-08-07 DIAGNOSIS — Z803 Family history of malignant neoplasm of breast: Secondary | ICD-10-CM

## 2015-08-07 DIAGNOSIS — Z7952 Long term (current) use of systemic steroids: Secondary | ICD-10-CM

## 2015-08-07 DIAGNOSIS — F329 Major depressive disorder, single episode, unspecified: Secondary | ICD-10-CM | POA: Diagnosis not present

## 2015-08-07 DIAGNOSIS — D693 Immune thrombocytopenic purpura: Secondary | ICD-10-CM

## 2015-08-07 DIAGNOSIS — Z8781 Personal history of (healed) traumatic fracture: Secondary | ICD-10-CM

## 2015-08-07 DIAGNOSIS — Z9181 History of falling: Secondary | ICD-10-CM

## 2015-08-07 DIAGNOSIS — Z87891 Personal history of nicotine dependence: Secondary | ICD-10-CM

## 2015-08-07 DIAGNOSIS — I10 Essential (primary) hypertension: Secondary | ICD-10-CM

## 2015-08-07 DIAGNOSIS — G4700 Insomnia, unspecified: Secondary | ICD-10-CM | POA: Diagnosis not present

## 2015-08-07 LAB — CBC WITH DIFFERENTIAL/PLATELET
BASOS ABS: 0 10*3/uL (ref 0–0.1)
Basophils Relative: 0 %
EOS PCT: 1 %
Eosinophils Absolute: 0.2 10*3/uL (ref 0–0.7)
HEMATOCRIT: 47.7 % — AB (ref 35.0–47.0)
Hemoglobin: 15.5 g/dL (ref 12.0–16.0)
LYMPHS PCT: 6 %
Lymphs Abs: 0.7 10*3/uL — ABNORMAL LOW (ref 1.0–3.6)
MCH: 30.8 pg (ref 26.0–34.0)
MCHC: 32.6 g/dL (ref 32.0–36.0)
MCV: 94.5 fL (ref 80.0–100.0)
MONO ABS: 0.4 10*3/uL (ref 0.2–0.9)
MONOS PCT: 3 %
Neutro Abs: 11.7 10*3/uL — ABNORMAL HIGH (ref 1.4–6.5)
Neutrophils Relative %: 90 %
PLATELETS: 170 10*3/uL (ref 150–440)
RBC: 5.05 MIL/uL (ref 3.80–5.20)
RDW: 13.9 % (ref 11.5–14.5)
WBC: 13.1 10*3/uL — ABNORMAL HIGH (ref 3.6–11.0)

## 2015-08-07 NOTE — Progress Notes (Signed)
Mild pain in right ankle. States will get cast off next Friday, 8/4. Took last prednisone this morning.

## 2015-08-07 NOTE — Progress Notes (Signed)
[facility]  [phone] [phone]  ID: Nancy Love OB: 30-Mar-1950  MR#: [id number]  AOZ#:[id number]  Patient Care Team: Ellamae Sia, MD as PCP - General (Internal Medicine)  CHIEF COMPLAINT: ITP  INTERVAL HISTORY: Patient returns to clinic today for further evaluation and repeat laboratory work. She has had difficulty sleeping secondary to the prednisone, but otherwise feels well. She denies any easy bleeding or bruising. She has a good appetite and denies weight loss. She has no neurological complaints. She denies any recent fevers or illnesses. She has no chest pain or shortness of breath. She denies any nausea, vomiting, constipation, or diarrhea. She has no urinary complaints. Patient offers no further specific complaints today.  REVIEW OF SYSTEMS:   Review of Systems  Constitutional: Negative.  Negative for fever, malaise/fatigue and weight loss.  Respiratory: Negative.  Negative for cough, hemoptysis and shortness of breath.   Cardiovascular: Negative.  Negative for chest pain.  Gastrointestinal: Negative.  Negative for abdominal pain and melena.  Genitourinary: Negative.  Negative for hematuria.  Musculoskeletal:       Right leg pain secondary to fractured ankle.  Neurological: Negative.  Negative for weakness.  Endo/Heme/Allergies: Does not bruise/bleed easily.  Psychiatric/Behavioral: The patient has insomnia.     As per HPI. Otherwise, a complete review of systems is negatve.  PAST MEDICAL HISTORY: Past Medical History:  Diagnosis Date  . Allergy   . Depression   . Hypertension     PAST SURGICAL HISTORY: Past Surgical History:  Procedure Laterality Date  . CARPAL TUNNEL RELEASE    . TONSILLECTOMY Bilateral 1968    FAMILY HISTORY Family History  Problem Relation Age of Onset  . Breast cancer Mother     92's  . Breast cancer Daughter     17's   . Clotting disorder Daughter        ADVANCED DIRECTIVES:     HEALTH MAINTENANCE: Social History  Substance Use Topics  . Smoking status: Former Smoker    Quit date: 04/19/2014  . Smokeless tobacco: Not on file  . Alcohol use Yes     Colonoscopy:  PAP:  Bone density:  Lipid panel:  No Known Allergies  Current Outpatient Prescriptions  Medication Sig Dispense Refill  . albuterol (PROAIR HFA) 108 (90 Base) MCG/ACT inhaler Inhale 1 puff into the lungs every 4 (four) hours as needed.    . cetirizine (ZYRTEC) 10 MG tablet Take 10 mg by mouth daily.    Marland Kitchen FLUoxetine (PROZAC) 40 MG capsule Take 80 mg by mouth daily.   2  . gabapentin (NEURONTIN) 400 MG capsule Take 400 mg by mouth 2 (two) times daily.   2  . hydrochlorothiazide (HYDRODIURIL) 25 MG tablet Reported on 07/21/2015  2  . nitroGLYCERIN (NITROSTAT) 0.4 MG SL tablet Place 1 tablet under the tongue every 5 (five) minutes as needed.    Marland Kitchen oxycodone (OXY-IR) 5 MG capsule Take 5 mg by mouth every 4 (four) hours as needed.    . traZODone (DESYREL) 50 MG tablet Take 50 mg by mouth at bedtime as needed.   2   No current facility-administered medications for this visit.     OBJECTIVE: Vitals:   08/07/15 1131  BP: 134/84  Pulse: 82  Resp: 18  Temp: 99.1 F (37.3 C)     Body mass index is 19.66 kg/m.    ECOG FS:0 - Asymptomatic  General: Well-developed, well-nourished, no acute distress. Eyes: Pink conjunctiva, anicteric sclera. Lungs: Clear  to auscultation bilaterally. Heart: Regular rate and rhythm. No rubs, murmurs, or gallops. Abdomen: Soft, nontender, nondistended. No organomegaly noted, normoactive bowel sounds. Musculoskeletal: No edema, cyanosis, or clubbing. Right leg and cast up to just below the knee. Neuro: Alert, answering all questions appropriately. Cranial nerves grossly intact. Skin: No rashes or petechiae noted. Psych: Normal affect.   LAB RESULTS:  No results found for: NA, K, CL, CO2, GLUCOSE, BUN, CREATININE, CALCIUM, PROT, ALBUMIN, AST, ALT, ALKPHOS, BILITOT,  GFRNONAA, GFRAA  Lab Results  Component Value Date   WBC 13.1 (H) 08/07/2015   NEUTROABS 11.7 (H) 08/07/2015   HGB 15.5 08/07/2015   HCT 47.7 (H) 08/07/2015   MCV 94.5 08/07/2015   PLT 170 08/07/2015     STUDIES: Ct Biopsy  Result Date: 08/16/15 INDICATION: Thrombocytopenia. EXAM: CT BIOPSY MEDICATIONS: None. ANESTHESIA/SEDATION: Moderate (conscious) sedation was employed during this procedure. A total of Versed 2.0 mg and Fentanyl 100 mcg was administered intravenously. Moderate Sedation Time: 35 minutes. The patient's level of consciousness and vital signs were monitored continuously by radiology nursing throughout the procedure under my direct supervision. COMPLICATIONS: None immediate. PROCEDURE: Informed written consent was obtained from the patient after a thorough discussion of the procedural risks, benefits and alternatives. All questions were addressed. Sterile Barrier Technique was utilized including mask, sterile gloves, sterile drape, hand hygiene and skin antiseptic. A timeout was performed prior to the initiation of the procedure. The patient was placed prone on the CT scanner. Posterior flank region was prepped and draped using sterile technique. Local anesthetic was applied. Under CT guidance, 22 gauge spinal needle was directed toward posterior margin of right iliac bone. Local anesthetic was injected subperiosteally. Then, needle was removed. Also, under CT guidance, trocar was directed through posterior margin of right iliac bone. Bone marrow aspirate was obtained with and without heparin and given to the pathology technologist who was present. Then, bone marrow core biopsy was obtained and also given to the pathology technologist. Needle was removed and appropriate dressing was applied. IMPRESSION: Under CT guidance, successful percutaneous bone marrow aspiration and core biopsy of right iliac bone was performed. Electronically Signed   By: Marijo Conception, M.D.   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 Previously, patient's laboratory work today other than her platelets were within normal limits. Her platelet count significantly improved with prednisone. No intervention is needed at this time.  Return to clinic in one week for laboratory work and to assess the durability of her treatment. If not a durable response, will initiate 4 weekly cycles of Rituxan.  2. Broken ankle: Continue treatment per orthopedics.  Patient gets her cast of next week. 3. Family history breast cancer: By report, patient's mother had breast cancer in her 49s and her daughter had breast cancer in her 30s. Will consider genetic testing in the future.   Patient expressed understanding and was in agreement with this plan. She also understands that She can call clinic at any time with any questions, concerns, or complaints.    Lloyd Huger, MD   08/07/2015 11:09 PM

## 2015-08-10 ENCOUNTER — Telehealth: Payer: Self-pay | Admitting: Oncology

## 2015-08-10 NOTE — Telephone Encounter (Signed)
Dr. Grayland Ormond does not prescribe pain medication for patient, he states she will have to get prescription from PCP or orthopedic surgeon.

## 2015-08-10 NOTE — Telephone Encounter (Signed)
She ran out of her oxycodone on Saturday and needs more. Can you please write a Rx and she can come to [facility] to get it? Thank you!

## 2015-08-12 ENCOUNTER — Telehealth: Payer: Self-pay | Admitting: Oncology

## 2015-08-12 ENCOUNTER — Other Ambulatory Visit: Payer: Self-pay | Admitting: *Deleted

## 2015-08-12 DIAGNOSIS — D693 Immune thrombocytopenic purpura: Secondary | ICD-10-CM

## 2015-08-12 NOTE — Telephone Encounter (Signed)
Orders updated for patient to come in tomorrow, 8/3. Thanks.

## 2015-08-12 NOTE — Telephone Encounter (Signed)
Sure

## 2015-08-12 NOTE — Telephone Encounter (Signed)
Patient wants to come in on 8/3 to do labs that are scheduled for 8/4. We won't have anyone to draw her blood in cancer center so I will take her across the hall to do the labs. Nancy Love said you will need to write in the expected date or the other lab won't be able to see the orders that are needed for tomorrow. Can you please write in expected date for whichever labs you need her to do tomorrow? Thanks.

## 2015-08-13 ENCOUNTER — Inpatient Hospital Stay: Payer: BLUE CROSS/BLUE SHIELD | Attending: Oncology

## 2015-08-13 ENCOUNTER — Other Ambulatory Visit: Payer: Self-pay | Admitting: *Deleted

## 2015-08-13 DIAGNOSIS — Z87891 Personal history of nicotine dependence: Secondary | ICD-10-CM | POA: Insufficient documentation

## 2015-08-13 DIAGNOSIS — I10 Essential (primary) hypertension: Secondary | ICD-10-CM | POA: Insufficient documentation

## 2015-08-13 DIAGNOSIS — Z803 Family history of malignant neoplasm of breast: Secondary | ICD-10-CM | POA: Insufficient documentation

## 2015-08-13 DIAGNOSIS — Z79899 Other long term (current) drug therapy: Secondary | ICD-10-CM | POA: Diagnosis not present

## 2015-08-13 DIAGNOSIS — Z8781 Personal history of (healed) traumatic fracture: Secondary | ICD-10-CM | POA: Diagnosis not present

## 2015-08-13 DIAGNOSIS — G4700 Insomnia, unspecified: Secondary | ICD-10-CM | POA: Insufficient documentation

## 2015-08-13 DIAGNOSIS — D693 Immune thrombocytopenic purpura: Secondary | ICD-10-CM | POA: Insufficient documentation

## 2015-08-13 DIAGNOSIS — F329 Major depressive disorder, single episode, unspecified: Secondary | ICD-10-CM | POA: Diagnosis not present

## 2015-08-13 DIAGNOSIS — Z7952 Long term (current) use of systemic steroids: Secondary | ICD-10-CM | POA: Diagnosis not present

## 2015-08-13 LAB — CBC WITH DIFFERENTIAL/PLATELET
Basophils Absolute: 0.1 10*3/uL (ref 0–0.1)
EOS ABS: 0.3 10*3/uL (ref 0–0.7)
Eosinophils Relative: 3 %
HEMATOCRIT: 45.9 % (ref 35.0–47.0)
HEMOGLOBIN: 14.9 g/dL (ref 12.0–16.0)
LYMPHS ABS: 1.5 10*3/uL (ref 1.0–3.6)
Lymphocytes Relative: 14 %
MCH: 30.8 pg (ref 26.0–34.0)
MCHC: 32.4 g/dL (ref 32.0–36.0)
MCV: 94.9 fL (ref 80.0–100.0)
MONO ABS: 0.9 10*3/uL (ref 0.2–0.9)
NEUTROS ABS: 8.1 10*3/uL — AB (ref 1.4–6.5)
Neutrophils Relative %: 73 %
Platelets: 53 10*3/uL — ABNORMAL LOW (ref 150–440)
RBC: 4.83 MIL/uL (ref 3.80–5.20)
RDW: 13.9 % (ref 11.5–14.5)
WBC: 11 10*3/uL (ref 3.6–11.0)

## 2015-08-14 ENCOUNTER — Other Ambulatory Visit: Payer: BLUE CROSS/BLUE SHIELD

## 2015-08-18 ENCOUNTER — Telehealth: Payer: Self-pay | Admitting: Internal Medicine

## 2015-08-18 ENCOUNTER — Other Ambulatory Visit: Payer: Self-pay | Admitting: Internal Medicine

## 2015-08-18 ENCOUNTER — Telehealth: Payer: Self-pay | Admitting: Oncology

## 2015-08-18 DIAGNOSIS — D693 Immune thrombocytopenic purpura: Secondary | ICD-10-CM

## 2015-08-18 NOTE — Telephone Encounter (Signed)
Discussed that she would need Rituxan; however patient prefers to get a treatment in [place]. I tried to explain to her that- she might not get the treatment until next week/ because of lack of providers in [place] the next many days. She does not want to start her treatment in [place].   # Patient needs to meet a provider- to discuss the risk and benefits of Rituxan before starting Rituxan infusion.   # Recommend checking hepatitis B serologies; check labs tomorrow. Orders- placed.   Brenda/kendra- please follow.

## 2015-08-18 NOTE — Telephone Encounter (Signed)
Please call pt regarding her last labs. Thanks.

## 2015-08-18 NOTE — Telephone Encounter (Signed)
Platelet count has dropped from 170 to 53 in a weeks time. Per Dr Gary Fleet ON, if response was not durable, will initiate Rituxan. Please advise

## 2015-08-19 ENCOUNTER — Inpatient Hospital Stay: Payer: BLUE CROSS/BLUE SHIELD

## 2015-08-19 ENCOUNTER — Other Ambulatory Visit: Payer: Self-pay | Admitting: Internal Medicine

## 2015-08-19 ENCOUNTER — Telehealth: Payer: Self-pay | Admitting: *Deleted

## 2015-08-19 DIAGNOSIS — D693 Immune thrombocytopenic purpura: Secondary | ICD-10-CM

## 2015-08-19 LAB — CBC WITH DIFFERENTIAL/PLATELET
BASOS ABS: 0.1 10*3/uL (ref 0–0.1)
Eosinophils Absolute: 0.3 10*3/uL (ref 0–0.7)
Eosinophils Relative: 4 %
HEMATOCRIT: 46.1 % (ref 35.0–47.0)
HEMOGLOBIN: 15.1 g/dL (ref 12.0–16.0)
Lymphocytes Relative: 20 %
Lymphs Abs: 1.7 10*3/uL (ref 1.0–3.6)
MCH: 30.7 pg (ref 26.0–34.0)
MCHC: 32.8 g/dL (ref 32.0–36.0)
MCV: 93.6 fL (ref 80.0–100.0)
Monocytes Absolute: 0.5 10*3/uL (ref 0.2–0.9)
Monocytes Relative: 6 %
NEUTROS ABS: 5.9 10*3/uL (ref 1.4–6.5)
Platelets: 32 10*3/uL — ABNORMAL LOW (ref 150–440)
RBC: 4.93 MIL/uL (ref 3.80–5.20)
RDW: 13.7 % (ref 11.5–14.5)
WBC: 8.4 10*3/uL (ref 3.6–11.0)

## 2015-08-19 MED ORDER — PREDNISONE 20 MG PO TABS: 40.0000 mg | ORAL_TABLET | Freq: Every day | ORAL | 0 refills | Status: DC

## 2015-08-19 NOTE — Telephone Encounter (Signed)
I spoke with Mickel Baas scheduler in [place] to see about  getting her scheduled for lab/ md/ Rituxan next week, there are no chairs available in [facility] 8/18. Mickel Baas called back and has spoke with pt who agrees to come to [facility] Monday per [place] transportation. Dr B has put orders in and Luann has been notified. Patient still awaiting to see about a ride for labs today

## 2015-08-19 NOTE — Telephone Encounter (Signed)
Called patient to inform her of further drop in her platelet count and that Dr B wants to send prednisone prescription to pharmacy. Patient asks it be sent to [facility]. Sent

## 2015-08-19 NOTE — Telephone Encounter (Signed)
Telephone Encounter Encounter Date: 08/18/2015 [name], MD  Oncology    Discussed that she would need Rituxan; however patient prefers to get a treatment in [place]. I tried to explain to her that- she might not get the treatment until next week/ because of lack of providers in [place] the next many days. She does not want to start her treatment in [place].   # Patient needs to meet a provider- to discuss the risk and benefits of Rituxan before starting Rituxan infusion.   # Recommend checking hepatitis B serologies; check labs tomorrow. Orders- placed.   Nancy Love/kendra- please follow     Patient unsure that she can get to office for labs today, she will call me back this morning and let me know. I gave her my direct number. Also asked her about coming in for lab/ md/ chemo Friday 8/18 and she will let me know that as well

## 2015-08-20 LAB — HEPATITIS B CORE ANTIBODY, IGM: HEP B C IGM: NEGATIVE

## 2015-08-20 LAB — HEPATITIS B SURFACE ANTIGEN: HEP B S AG: NEGATIVE

## 2015-08-23 NOTE — Progress Notes (Signed)
Nancy Love  [phone] [phone]  ID: Nancy Love OB: Feb 25, 1950  MR#: [id number]  OIT#:[id number]  Patient Care Team: Ellamae Sia, MD as PCP - General (Internal Medicine)  CHIEF COMPLAINT: ITP  INTERVAL HISTORY: Patient returns to clinic today for further evaluation and consideration of cycle 1 of 4 of weekly Rituxan. She continues to have difficulty sleeping secondary to the prednisone, but otherwise feels well. She denies any easy bleeding or bruising. She has a good appetite and denies weight loss. She has no neurological complaints. She denies any recent fevers or illnesses. She has no chest pain or shortness of breath. She denies any nausea, vomiting, constipation, or diarrhea. She has no urinary complaints. Patient offers no further specific complaints today.  REVIEW OF SYSTEMS:   Review of Systems  Constitutional: Negative.  Negative for fever, malaise/fatigue and weight loss.  Respiratory: Negative.  Negative for cough, hemoptysis and shortness of breath.   Cardiovascular: Negative.  Negative for chest pain.  Gastrointestinal: Negative.  Negative for abdominal pain and melena.  Genitourinary: Negative.  Negative for hematuria.  Musculoskeletal:       Right leg pain secondary to fractured ankle.  Neurological: Negative.  Negative for weakness.  Endo/Heme/Allergies: Does not bruise/bleed easily.  Psychiatric/Behavioral: The patient has insomnia.     As per HPI. Otherwise, a complete review of systems is negatve.  PAST MEDICAL HISTORY: Past Medical History:  Diagnosis Date  . Allergy   . Depression   . Hypertension     PAST SURGICAL HISTORY: Past Surgical History:  Procedure Laterality Date  . CARPAL TUNNEL RELEASE    . TONSILLECTOMY Bilateral 1968    FAMILY HISTORY Family History  Problem Relation Age of Onset  . Breast cancer Mother     53's  . Breast cancer Daughter     17's   . Clotting disorder Daughter          ADVANCED DIRECTIVES:    HEALTH MAINTENANCE: Social History  Substance Use Topics  . Smoking status: Former Smoker    Quit date: 04/19/2014  . Smokeless tobacco: Not on file  . Alcohol use Yes     Colonoscopy:  PAP:  Bone density:  Lipid panel:  No Known Allergies  Current Outpatient Prescriptions  Medication Sig Dispense Refill  . albuterol (PROAIR HFA) 108 (90 Base) MCG/ACT inhaler Inhale 1 puff into the lungs every 4 (four) hours as needed.    . cetirizine (ZYRTEC) 10 MG tablet Take 10 mg by mouth daily.    Marland Kitchen FLUoxetine (PROZAC) 40 MG capsule Take 80 mg by mouth daily.   2  . gabapentin (NEURONTIN) 400 MG capsule Take 400 mg by mouth 2 (two) times daily.   2  . hydrochlorothiazide (HYDRODIURIL) 25 MG tablet Reported on 07/21/2015  2  . nitroGLYCERIN (NITROSTAT) 0.4 MG SL tablet Place 1 tablet under the tongue every 5 (five) minutes as needed.    . predniSONE (DELTASONE) 20 MG tablet Take 2 tablets (40 mg total) by mouth daily with breakfast. 14 tablet 0  . traMADol (ULTRAM) 50 MG tablet Take 1 tablet by mouth every 6 (six) hours as needed.    . traZODone (DESYREL) 50 MG tablet Take 50 mg by mouth at bedtime as needed.   2   No current facility-administered medications for this visit.     OBJECTIVE: Vitals:   08/24/15 0849  BP: 135/83  Pulse: 82  Resp: 18  Temp: 97.9 F (36.6 C)  Body mass index is 19.49 kg/m.    ECOG FS:0 - Asymptomatic  General: Well-developed, well-nourished, no acute distress. Eyes: Pink conjunctiva, anicteric sclera. Lungs: Clear to auscultation bilaterally. Heart: Regular rate and rhythm. No rubs, murmurs, or gallops. Abdomen: Soft, nontender, nondistended. No organomegaly noted, normoactive bowel sounds. Musculoskeletal: No edema, cyanosis, or clubbing. Right leg now in walking boot. Neuro: Alert, answering all questions appropriately. Cranial nerves grossly intact. Skin: No rashes or petechiae noted. Psych: Normal  affect.   LAB RESULTS:  No results found for: NA, K, CL, CO2, GLUCOSE, BUN, CREATININE, CALCIUM, PROT, ALBUMIN, AST, ALT, ALKPHOS, BILITOT, GFRNONAA, GFRAA  Lab Results  Component Value Date   WBC 12.9 (H) 08/24/2015   NEUTROABS 11.8 (H) 08/24/2015   HGB 15.8 08/24/2015   HCT 47.4 (H) 08/24/2015   MCV 93.9 08/24/2015   PLT 78 (L) 08/24/2015     STUDIES: No results found.  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 Previously, patient's laboratory work today other than her platelets were within normal limits. Her platelet count is improved with prednisone. Because treatment with prednisone has not been durable, will proceed with cycle 1 of 4 of weekly Rituxan. Patient wishes to have her treatments in [place], therefore she will return to clinic in 11 days on September 04, 2015 for cycle 2.  2. Broken ankle: Continue treatment per orthopedics.  3. Family history breast cancer: By report, patient's mother had breast cancer in her 95s and her daughter had breast cancer in her 35s. Will consider genetic testing in the future.   Patient expressed understanding and was in agreement with this plan. She also understands that She can call clinic at any time with any questions, concerns, or complaints.    Lloyd Huger, MD   08/24/2015 11:24 AM

## 2015-08-24 ENCOUNTER — Inpatient Hospital Stay: Payer: BLUE CROSS/BLUE SHIELD

## 2015-08-24 ENCOUNTER — Encounter (INDEPENDENT_AMBULATORY_CARE_PROVIDER_SITE_OTHER): Payer: Self-pay

## 2015-08-24 ENCOUNTER — Inpatient Hospital Stay (HOSPITAL_BASED_OUTPATIENT_CLINIC_OR_DEPARTMENT_OTHER): Payer: BLUE CROSS/BLUE SHIELD | Admitting: Oncology

## 2015-08-24 VITALS — BP 135/83 | HR 82 | Temp 97.9°F | Resp 18 | Wt 103.2 lb

## 2015-08-24 VITALS — BP 114/72 | HR 76

## 2015-08-24 DIAGNOSIS — I10 Essential (primary) hypertension: Secondary | ICD-10-CM | POA: Diagnosis not present

## 2015-08-24 DIAGNOSIS — Z7952 Long term (current) use of systemic steroids: Secondary | ICD-10-CM

## 2015-08-24 DIAGNOSIS — Z8781 Personal history of (healed) traumatic fracture: Secondary | ICD-10-CM

## 2015-08-24 DIAGNOSIS — Z803 Family history of malignant neoplasm of breast: Secondary | ICD-10-CM

## 2015-08-24 DIAGNOSIS — D693 Immune thrombocytopenic purpura: Secondary | ICD-10-CM

## 2015-08-24 DIAGNOSIS — G4700 Insomnia, unspecified: Secondary | ICD-10-CM | POA: Diagnosis not present

## 2015-08-24 DIAGNOSIS — F329 Major depressive disorder, single episode, unspecified: Secondary | ICD-10-CM

## 2015-08-24 DIAGNOSIS — Z87891 Personal history of nicotine dependence: Secondary | ICD-10-CM

## 2015-08-24 DIAGNOSIS — Z79899 Other long term (current) drug therapy: Secondary | ICD-10-CM

## 2015-08-24 LAB — CBC WITH DIFFERENTIAL/PLATELET
BASOS ABS: 0 10*3/uL (ref 0–0.1)
Basophils Relative: 0 %
Eosinophils Absolute: 0 10*3/uL (ref 0–0.7)
HCT: 47.4 % — ABNORMAL HIGH (ref 35.0–47.0)
Hemoglobin: 15.8 g/dL (ref 12.0–16.0)
Lymphs Abs: 0.9 10*3/uL — ABNORMAL LOW (ref 1.0–3.6)
MCH: 31.3 pg (ref 26.0–34.0)
MCHC: 33.3 g/dL (ref 32.0–36.0)
MCV: 93.9 fL (ref 80.0–100.0)
Monocytes Absolute: 0.2 10*3/uL (ref 0.2–0.9)
Monocytes Relative: 2 %
Neutro Abs: 11.8 10*3/uL — ABNORMAL HIGH (ref 1.4–6.5)
Neutrophils Relative %: 91 %
PLATELETS: 78 10*3/uL — AB (ref 150–440)
RBC: 5.05 MIL/uL (ref 3.80–5.20)
RDW: 13.8 % (ref 11.5–14.5)
WBC: 12.9 10*3/uL — AB (ref 3.6–11.0)

## 2015-08-24 MED ORDER — ACETAMINOPHEN 325 MG PO TABS
650.0000 mg | ORAL_TABLET | Freq: Once | ORAL | Status: AC
  Administered 2015-08-24: 650 mg via ORAL
  Filled 2015-08-24: qty 2

## 2015-08-24 MED ORDER — SODIUM CHLORIDE 0.9 % IV SOLN
375.0000 mg/m2 | Freq: Once | INTRAVENOUS | Status: AC
  Administered 2015-08-24: 500 mg via INTRAVENOUS
  Filled 2015-08-24: qty 50

## 2015-08-24 MED ORDER — DIPHENHYDRAMINE HCL 25 MG PO CAPS
50.0000 mg | ORAL_CAPSULE | Freq: Once | ORAL | Status: AC
  Administered 2015-08-24: 50 mg via ORAL
  Filled 2015-08-24: qty 2

## 2015-08-24 MED ORDER — SODIUM CHLORIDE 0.9 % IV SOLN
Freq: Once | INTRAVENOUS | Status: AC
  Administered 2015-08-24: 10:00:00 via INTRAVENOUS
  Filled 2015-08-24: qty 1000

## 2015-08-24 NOTE — Progress Notes (Signed)
States is feeling well. Having difficulty sleeping at night.

## 2015-09-01 NOTE — Progress Notes (Signed)
Nancy Love  [phone] [phone]  ID: Nancy Love OB: Nov 19, 1950  MR#: [id number]  MPN#:[id number]  Patient Care Team: Ellamae Sia, MD as PCP - General (Internal Medicine)  CHIEF COMPLAINT: ITP  INTERVAL HISTORY: Patient returns to clinic today for further evaluation and consideration of cycle 2 of 4 of weekly Rituxan. She currently feels well and is asymptomatic. She denies any easy bleeding or bruising. She has a good appetite and denies weight loss. She has no neurologic complaints. She denies any recent fevers or illnesses. She has no chest pain or shortness of breath. She denies any nausea, vomiting, constipation, or diarrhea. She has no urinary complaints. Patient offers no specific complaints today.  REVIEW OF SYSTEMS:   Review of Systems  Constitutional: Negative.  Negative for fever, malaise/fatigue and weight loss.  Respiratory: Negative.  Negative for cough, hemoptysis and shortness of breath.   Cardiovascular: Negative.  Negative for chest pain.  Gastrointestinal: Negative.  Negative for abdominal pain and melena.  Genitourinary: Negative.  Negative for hematuria.  Musculoskeletal:       Right leg pain secondary to fractured ankle.  Neurological: Negative.  Negative for weakness.  Endo/Heme/Allergies: Does not bruise/bleed easily.  Psychiatric/Behavioral: Negative.  The patient does not have insomnia.     As per HPI. Otherwise, a complete review of systems is negatve.  PAST MEDICAL HISTORY: Past Medical History:  Diagnosis Date  . Allergy   . Depression   . Hypertension     PAST SURGICAL HISTORY: Past Surgical History:  Procedure Laterality Date  . CARPAL TUNNEL RELEASE    . TONSILLECTOMY Bilateral 1968    FAMILY HISTORY Family History  Problem Relation Age of Onset  . Breast cancer Mother     49's  . Breast cancer Daughter     27's   . Clotting disorder Daughter        ADVANCED DIRECTIVES:     HEALTH MAINTENANCE: Social History  Substance Use Topics  . Smoking status: Former Smoker    Quit date: 04/19/2014  . Smokeless tobacco: Not on file  . Alcohol use Yes     Colonoscopy:  PAP:  Bone density:  Lipid panel:  No Known Allergies  Current Outpatient Prescriptions  Medication Sig Dispense Refill  . albuterol (PROAIR HFA) 108 (90 Base) MCG/ACT inhaler Inhale 1 puff into the lungs every 4 (four) hours as needed.    . cetirizine (ZYRTEC) 10 MG tablet Take 10 mg by mouth daily.    Marland Kitchen FLUoxetine (PROZAC) 40 MG capsule Take 80 mg by mouth daily.   2  . gabapentin (NEURONTIN) 400 MG capsule Take 400 mg by mouth 2 (two) times daily.   2  . hydrochlorothiazide (HYDRODIURIL) 25 MG tablet Reported on 07/21/2015  2  . nitroGLYCERIN (NITROSTAT) 0.4 MG SL tablet Place 1 tablet under the tongue every 5 (five) minutes as needed.    . predniSONE (DELTASONE) 20 MG tablet Take 2 tablets (40 mg total) by mouth daily with breakfast. 14 tablet 0  . traMADol (ULTRAM) 50 MG tablet Take 1 tablet by mouth every 6 (six) hours as needed.    . traZODone (DESYREL) 50 MG tablet Take 50 mg by mouth at bedtime as needed.   2   No current facility-administered medications for this visit.     OBJECTIVE: There were no vitals filed for this visit.   There is no height or weight on file to calculate BMI.    ECOG FS:0 -  Asymptomatic  General: Well-developed, well-nourished, no acute distress. Eyes: Pink conjunctiva, anicteric sclera. Lungs: Clear to auscultation bilaterally. Heart: Regular rate and rhythm. No rubs, murmurs, or gallops. Abdomen: Soft, nontender, nondistended. No organomegaly noted, normoactive bowel sounds. Musculoskeletal: No edema, cyanosis, or clubbing. Right leg now in splint. Neuro: Alert, answering all questions appropriately. Cranial nerves grossly intact. Skin: No rashes or petechiae noted. Psych: Normal affect.   LAB RESULTS:  No results found for: NA, K, CL, CO2, GLUCOSE,  BUN, CREATININE, CALCIUM, PROT, ALBUMIN, AST, ALT, ALKPHOS, BILITOT, GFRNONAA, GFRAA  Lab Results  Component Value Date   WBC 10.3 09/04/2015   NEUTROABS 8.0 (H) 09/04/2015   HGB 14.7 09/04/2015   HCT 44.8 09/04/2015   MCV 92.8 09/04/2015   PLT 38 (L) 09/04/2015     STUDIES: No results found.  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 Previously, patient's laboratory work today other than her platelets were within normal limits. Her platelet count has slightly declined. Because treatment with prednisone has not been durable, will proceed with cycle 2 of 4 of weekly Rituxan. Return to clinic in 1 one week for consideration of cycle 3.  2. Broken ankle: Continue treatment per orthopedics.  3. Family history breast cancer: By report, patient's mother had breast cancer in her 40s and her daughter had breast cancer in her 30s. Will consider genetic testing in the future.   Patient expressed understanding and was in agreement with this plan. She also understands that She can call clinic at any time with any questions, concerns, or complaints.     J , MD   09/04/2015 1:27 PM      

## 2015-09-04 ENCOUNTER — Inpatient Hospital Stay: Payer: BLUE CROSS/BLUE SHIELD

## 2015-09-04 ENCOUNTER — Inpatient Hospital Stay (HOSPITAL_BASED_OUTPATIENT_CLINIC_OR_DEPARTMENT_OTHER): Payer: BLUE CROSS/BLUE SHIELD | Admitting: Oncology

## 2015-09-04 VITALS — BP 123/82 | HR 73 | Temp 97.5°F | Resp 20

## 2015-09-04 DIAGNOSIS — Z87891 Personal history of nicotine dependence: Secondary | ICD-10-CM

## 2015-09-04 DIAGNOSIS — Z79899 Other long term (current) drug therapy: Secondary | ICD-10-CM

## 2015-09-04 DIAGNOSIS — D693 Immune thrombocytopenic purpura: Secondary | ICD-10-CM | POA: Diagnosis not present

## 2015-09-04 DIAGNOSIS — G4700 Insomnia, unspecified: Secondary | ICD-10-CM

## 2015-09-04 DIAGNOSIS — Z7952 Long term (current) use of systemic steroids: Secondary | ICD-10-CM

## 2015-09-04 DIAGNOSIS — F329 Major depressive disorder, single episode, unspecified: Secondary | ICD-10-CM | POA: Diagnosis not present

## 2015-09-04 DIAGNOSIS — Z803 Family history of malignant neoplasm of breast: Secondary | ICD-10-CM

## 2015-09-04 DIAGNOSIS — I10 Essential (primary) hypertension: Secondary | ICD-10-CM

## 2015-09-04 DIAGNOSIS — Z8781 Personal history of (healed) traumatic fracture: Secondary | ICD-10-CM

## 2015-09-04 LAB — CBC WITH DIFFERENTIAL/PLATELET
Basophils Absolute: 0.1 10*3/uL (ref 0–0.1)
Basophils Relative: 1 %
Eosinophils Absolute: 0.4 10*3/uL (ref 0–0.7)
Eosinophils Relative: 4 %
HEMATOCRIT: 44.8 % (ref 35.0–47.0)
HEMOGLOBIN: 14.7 g/dL (ref 12.0–16.0)
LYMPHS ABS: 1.3 10*3/uL (ref 1.0–3.6)
MCH: 30.4 pg (ref 26.0–34.0)
MCHC: 32.7 g/dL (ref 32.0–36.0)
MCV: 92.8 fL (ref 80.0–100.0)
Monocytes Absolute: 0.7 10*3/uL (ref 0.2–0.9)
NEUTROS ABS: 8 10*3/uL — AB (ref 1.4–6.5)
Platelets: 38 10*3/uL — ABNORMAL LOW (ref 150–440)
RBC: 4.83 MIL/uL (ref 3.80–5.20)
RDW: 13.9 % (ref 11.5–14.5)
WBC: 10.3 10*3/uL (ref 3.6–11.0)

## 2015-09-04 MED ORDER — SODIUM CHLORIDE 0.9 % IV SOLN
Freq: Once | INTRAVENOUS | Status: AC
  Administered 2015-09-04: 09:00:00 via INTRAVENOUS
  Filled 2015-09-04: qty 1000

## 2015-09-04 MED ORDER — SODIUM CHLORIDE 0.9 % IV SOLN
375.0000 mg/m2 | Freq: Once | INTRAVENOUS | Status: AC
  Administered 2015-09-04: 500 mg via INTRAVENOUS
  Filled 2015-09-04: qty 50

## 2015-09-04 MED ORDER — DIPHENHYDRAMINE HCL 25 MG PO CAPS
50.0000 mg | ORAL_CAPSULE | Freq: Once | ORAL | Status: AC
  Administered 2015-09-04: 50 mg via ORAL
  Filled 2015-09-04: qty 2

## 2015-09-04 MED ORDER — SODIUM CHLORIDE 0.9 % IV SOLN: 375.0000 mg/m2 | Freq: Once | INTRAVENOUS | Status: DC

## 2015-09-04 MED ORDER — ACETAMINOPHEN 325 MG PO TABS
650.0000 mg | ORAL_TABLET | Freq: Once | ORAL | Status: AC
  Administered 2015-09-04: 650 mg via ORAL
  Filled 2015-09-04: qty 2

## 2015-09-04 NOTE — Progress Notes (Signed)
Patient here today for follow up and treatment regarding ITP. Patient seen by Dr. Grayland Ormond in infusion.

## 2015-09-10 NOTE — Progress Notes (Signed)
[place]  [phone] [phone]  ID: Nancy Love OB: 09/26/1950  MR#: [id number]  BHA#:[id number]  Patient Care Team: Nancy Sia, MD as PCP - General (Internal Medicine)  CHIEF COMPLAINT: ITP  INTERVAL HISTORY: Patient returns to clinic today for further evaluation and consideration of cycle 3 of 4 of weekly Rituxan. She currently feels well and is asymptomatic. She denies any easy bleeding or bruising. She has a good appetite and denies weight loss. She has no neurologic complaints. She denies any recent fevers or illnesses. She has no chest pain or shortness of breath. She denies any nausea, vomiting, constipation, or diarrhea. She has no urinary complaints. Patient offers no specific complaints today.  REVIEW OF SYSTEMS:   Review of Systems  Constitutional: Negative.  Negative for fever, malaise/fatigue and weight loss.  Respiratory: Negative.  Negative for cough, hemoptysis and shortness of breath.   Cardiovascular: Negative.  Negative for chest pain.  Gastrointestinal: Negative.  Negative for abdominal pain and melena.  Genitourinary: Negative.  Negative for hematuria.  Musculoskeletal: Negative for joint pain.  Neurological: Negative.  Negative for weakness.  Endo/Heme/Allergies: Does not bruise/bleed easily.  Psychiatric/Behavioral: Negative.  The patient does not have insomnia.     As per HPI. Otherwise, a complete review of systems is negative.  PAST MEDICAL HISTORY: Past Medical History:  Diagnosis Date  . Allergy   . Depression   . Hypertension     PAST SURGICAL HISTORY: Past Surgical History:  Procedure Laterality Date  . CARPAL TUNNEL RELEASE    . TONSILLECTOMY Bilateral 1968    FAMILY HISTORY Family History  Problem Relation Age of Onset  . Breast cancer Mother     15's  . Breast cancer Daughter     39's   . Clotting disorder Daughter        ADVANCED DIRECTIVES:    HEALTH MAINTENANCE: Social  History  Substance Use Topics  . Smoking status: Former Smoker    Quit date: 04/19/2014  . Smokeless tobacco: Not on file  . Alcohol use Yes     Colonoscopy:  PAP:  Bone density:  Lipid panel:  No Known Allergies  Current Outpatient Prescriptions  Medication Sig Dispense Refill  . albuterol (PROAIR HFA) 108 (90 Base) MCG/ACT inhaler Inhale 1 puff into the lungs every 4 (four) hours as needed.    . cetirizine (ZYRTEC) 10 MG tablet Take 10 mg by mouth daily.    Marland Kitchen FLUoxetine (PROZAC) 40 MG capsule Take 80 mg by mouth daily.   2  . gabapentin (NEURONTIN) 400 MG capsule Take 400 mg by mouth 2 (two) times daily.   2  . hydrochlorothiazide (HYDRODIURIL) 25 MG tablet Reported on 07/21/2015  2  . nitroGLYCERIN (NITROSTAT) 0.4 MG SL tablet Place 1 tablet under the tongue every 5 (five) minutes as needed.    . predniSONE (DELTASONE) 20 MG tablet Take 2 tablets (40 mg total) by mouth daily with breakfast. 14 tablet 0  . traMADol (ULTRAM) 50 MG tablet Take 1 tablet by mouth every 6 (six) hours as needed.    . traZODone (DESYREL) 50 MG tablet Take 50 mg by mouth at bedtime as needed.   2   No current facility-administered medications for this visit.     OBJECTIVE: There were no vitals filed for this visit.   There is no height or weight on file to calculate BMI.    ECOG FS:0 - Asymptomatic  General: Well-developed, well-nourished, no acute distress. Eyes:  Pink conjunctiva, anicteric sclera. Lungs: Clear to auscultation bilaterally. Heart: Regular rate and rhythm. No rubs, murmurs, or gallops. Abdomen: Soft, nontender, nondistended. No organomegaly noted, normoactive bowel sounds. Musculoskeletal: No edema, cyanosis, or clubbing. Neuro: Alert, answering all questions appropriately. Cranial nerves grossly intact. Skin: No rashes or petechiae noted. Psych: Normal affect.   LAB RESULTS:  No results found for: NA, K, CL, CO2, GLUCOSE, BUN, CREATININE, CALCIUM, PROT, ALBUMIN, AST, ALT,  ALKPHOS, BILITOT, GFRNONAA, GFRAA  Lab Results  Component Value Date   WBC 18.4 (H) 09/11/2015   NEUTROABS 16.7 (H) 09/11/2015   HGB 14.9 09/11/2015   HCT 45.1 09/11/2015   MCV 90.6 09/11/2015   PLT 37 (L) 09/11/2015     STUDIES: No results found.  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 Previously, patient's laboratory work today other than her platelets were within normal limits. Her platelet count is essentially unchanged. Because treatment with prednisone has not been durable, will proceed with cycle 3 of 4 of weekly Rituxan. Return to clinic in 1 one week for consideration of cycle 4. If patient has no appreciable improvement in her platelet count with 4 infusions of Rituxan, would consider Nplate. 2. Broken ankle: Essentially resolved.  Continue treatment per orthopedics.  3. Family history breast cancer: By report, patient's mother had breast cancer in her 67s and her daughter had breast cancer in her 33s. Will consider genetic testing in the future.   Patient expressed understanding and was in agreement with this plan. She also understands that She can call clinic at any time with any questions, concerns, or complaints.    Lloyd Huger, MD   09/11/2015 3:57 PM

## 2015-09-11 ENCOUNTER — Inpatient Hospital Stay (HOSPITAL_BASED_OUTPATIENT_CLINIC_OR_DEPARTMENT_OTHER): Payer: BLUE CROSS/BLUE SHIELD | Admitting: Oncology

## 2015-09-11 ENCOUNTER — Inpatient Hospital Stay: Payer: BLUE CROSS/BLUE SHIELD

## 2015-09-11 ENCOUNTER — Inpatient Hospital Stay: Payer: BLUE CROSS/BLUE SHIELD | Attending: Oncology

## 2015-09-11 VITALS — BP 125/89 | HR 93 | Temp 98.2°F | Resp 18

## 2015-09-11 DIAGNOSIS — Z803 Family history of malignant neoplasm of breast: Secondary | ICD-10-CM

## 2015-09-11 DIAGNOSIS — F329 Major depressive disorder, single episode, unspecified: Secondary | ICD-10-CM | POA: Diagnosis not present

## 2015-09-11 DIAGNOSIS — D693 Immune thrombocytopenic purpura: Secondary | ICD-10-CM

## 2015-09-11 DIAGNOSIS — Z79899 Other long term (current) drug therapy: Secondary | ICD-10-CM | POA: Insufficient documentation

## 2015-09-11 DIAGNOSIS — Z8781 Personal history of (healed) traumatic fracture: Secondary | ICD-10-CM | POA: Diagnosis not present

## 2015-09-11 DIAGNOSIS — Z5112 Encounter for antineoplastic immunotherapy: Secondary | ICD-10-CM | POA: Insufficient documentation

## 2015-09-11 DIAGNOSIS — Z7951 Long term (current) use of inhaled steroids: Secondary | ICD-10-CM | POA: Insufficient documentation

## 2015-09-11 DIAGNOSIS — Z87891 Personal history of nicotine dependence: Secondary | ICD-10-CM | POA: Insufficient documentation

## 2015-09-11 DIAGNOSIS — I10 Essential (primary) hypertension: Secondary | ICD-10-CM | POA: Insufficient documentation

## 2015-09-11 LAB — CBC WITH DIFFERENTIAL/PLATELET
BASOS ABS: 0.1 10*3/uL (ref 0–0.1)
Basophils Relative: 1 %
EOS ABS: 0 10*3/uL (ref 0–0.7)
Eosinophils Relative: 0 %
HCT: 45.1 % (ref 35.0–47.0)
HEMOGLOBIN: 14.9 g/dL (ref 12.0–16.0)
LYMPHS ABS: 1.2 10*3/uL (ref 1.0–3.6)
Lymphocytes Relative: 7 %
MCH: 29.9 pg (ref 26.0–34.0)
MCHC: 33 g/dL (ref 32.0–36.0)
MCV: 90.6 fL (ref 80.0–100.0)
Monocytes Absolute: 0.4 10*3/uL (ref 0.2–0.9)
Monocytes Relative: 2 %
Neutro Abs: 16.7 10*3/uL — ABNORMAL HIGH (ref 1.4–6.5)
Platelets: 37 10*3/uL — ABNORMAL LOW (ref 150–440)
RBC: 4.97 MIL/uL (ref 3.80–5.20)
RDW: 13.5 % (ref 11.5–14.5)
WBC: 18.4 10*3/uL — AB (ref 3.6–11.0)

## 2015-09-11 MED ORDER — SODIUM CHLORIDE 0.9 % IV SOLN: 375.0000 mg/m2 | Freq: Once | INTRAVENOUS | Status: DC

## 2015-09-11 MED ORDER — ACETAMINOPHEN 325 MG PO TABS
650.0000 mg | ORAL_TABLET | Freq: Once | ORAL | Status: AC
  Administered 2015-09-11: 650 mg via ORAL
  Filled 2015-09-11: qty 2

## 2015-09-11 MED ORDER — SODIUM CHLORIDE 0.9 % IV SOLN
375.0000 mg/m2 | Freq: Once | INTRAVENOUS | Status: AC
  Administered 2015-09-11: 500 mg via INTRAVENOUS
  Filled 2015-09-11: qty 50

## 2015-09-11 MED ORDER — DIPHENHYDRAMINE HCL 25 MG PO CAPS
50.0000 mg | ORAL_CAPSULE | Freq: Once | ORAL | Status: AC
  Administered 2015-09-11: 50 mg via ORAL
  Filled 2015-09-11: qty 2

## 2015-09-11 MED ORDER — SODIUM CHLORIDE 0.9 % IV SOLN
Freq: Once | INTRAVENOUS | Status: AC
  Administered 2015-09-11: 12:00:00 via INTRAVENOUS
  Filled 2015-09-11: qty 1000

## 2015-09-11 NOTE — Progress Notes (Signed)
Patient here today for ongoing follow up and treatment regarding ITP. Patient seen by Dr. Grayland Ormond in infusion area.

## 2015-09-17 NOTE — Progress Notes (Signed)
[place]  [phone] [phone]  ID: Nancy Love OB: 04/11/1950  MR#: [id number]  ZES#:[id number]  Patient Care Team: Ellamae Sia, MD as PCP - General (Internal Medicine)  CHIEF COMPLAINT: ITP  INTERVAL HISTORY: Patient returns to clinic today for further evaluation and consideration of cycle 4 of 4 of weekly Rituxan. She currently feels well and is asymptomatic. She denies any easy bleeding or bruising. She has a good appetite and denies weight loss. She has no neurologic complaints. She denies any recent fevers or illnesses. She has no chest pain or shortness of breath. She denies any nausea, vomiting, constipation, or diarrhea. She has no urinary complaints. Patient offers no specific complaints today.  REVIEW OF SYSTEMS:   Review of Systems  Constitutional: Negative.  Negative for fever, malaise/fatigue and weight loss.  Respiratory: Negative.  Negative for cough, hemoptysis and shortness of breath.   Cardiovascular: Negative.  Negative for chest pain.  Gastrointestinal: Negative.  Negative for abdominal pain and melena.  Genitourinary: Negative.  Negative for hematuria.  Musculoskeletal: Negative for joint pain.  Neurological: Negative.  Negative for weakness.  Endo/Heme/Allergies: Does not bruise/bleed easily.  Psychiatric/Behavioral: Negative.  The patient does not have insomnia.     As per HPI. Otherwise, a complete review of systems is negative.  PAST MEDICAL HISTORY: Past Medical History:  Diagnosis Date  . Allergy   . Depression   . Hypertension     PAST SURGICAL HISTORY: Past Surgical History:  Procedure Laterality Date  . CARPAL TUNNEL RELEASE    . TONSILLECTOMY Bilateral 1968    FAMILY HISTORY Family History  Problem Relation Age of Onset  . Breast cancer Mother     41's  . Breast cancer Daughter     51's   . Clotting disorder Daughter        ADVANCED DIRECTIVES:    HEALTH MAINTENANCE: Social  History  Substance Use Topics  . Smoking status: Former Smoker    Quit date: 04/19/2014  . Smokeless tobacco: Never Used  . Alcohol use Yes     Colonoscopy:  PAP:  Bone density:  Lipid panel:  No Known Allergies  Current Outpatient Prescriptions  Medication Sig Dispense Refill  . albuterol (PROAIR HFA) 108 (90 Base) MCG/ACT inhaler Inhale 1 puff into the lungs every 4 (four) hours as needed.    . cetirizine (ZYRTEC) 10 MG tablet Take 10 mg by mouth daily.    Marland Kitchen FLUoxetine (PROZAC) 40 MG capsule Take 80 mg by mouth daily.   2  . gabapentin (NEURONTIN) 400 MG capsule Take 400 mg by mouth 2 (two) times daily.   2  . hydrochlorothiazide (HYDRODIURIL) 25 MG tablet Reported on 07/21/2015  2  . nitroGLYCERIN (NITROSTAT) 0.4 MG SL tablet Place 1 tablet under the tongue every 5 (five) minutes as needed.    . predniSONE (DELTASONE) 20 MG tablet Take 2 tablets (40 mg total) by mouth daily with breakfast. 14 tablet 0  . traMADol (ULTRAM) 50 MG tablet Take 1 tablet by mouth every 6 (six) hours as needed.    . traZODone (DESYREL) 50 MG tablet Take 50 mg by mouth at bedtime as needed.   2   No current facility-administered medications for this visit.     OBJECTIVE: Vitals:   09/18/15 0844  BP: 128/85  Pulse: 86  Temp: 97.5 F (36.4 C)     Body mass index is 19.83 kg/m.    ECOG FS:0 - Asymptomatic  General: Well-developed,  well-nourished, no acute distress. Eyes: Pink conjunctiva, anicteric sclera. Lungs: Clear to auscultation bilaterally. Heart: Regular rate and rhythm. No rubs, murmurs, or gallops. Abdomen: Soft, nontender, nondistended. No organomegaly noted, normoactive bowel sounds. Musculoskeletal: No edema, cyanosis, or clubbing. Neuro: Alert, answering all questions appropriately. Cranial nerves grossly intact. Skin: No rashes or petechiae noted. Psych: Normal affect.   LAB RESULTS:  No results found for: NA, K, CL, CO2, GLUCOSE, BUN, CREATININE, CALCIUM, PROT, ALBUMIN, AST,  ALT, ALKPHOS, BILITOT, GFRNONAA, GFRAA  Lab Results  Component Value Date   WBC 8.8 09/18/2015   NEUTROABS 6.6 (H) 09/18/2015   HGB 14.1 09/18/2015   HCT 42.8 09/18/2015   MCV 91.9 09/18/2015   PLT 42 (L) 09/18/2015     STUDIES: No results found.  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 Previously, patient's laboratory work today other than her platelets were within normal limits. Her platelet count is essentially unchanged. Because treatment with prednisone has not been durable, will proceed with cycle 4 of 4 of weekly Rituxan. Return to clinic in 4 weeks for repeat laboratory work and further evaluation.  If patient has no appreciable improvement in her platelet count with 4 infusions of Rituxan, would consider Nplate. 2. Broken ankle: Essentially resolved.  Continue treatment per orthopedics.  3. Family history breast cancer: By report, patient's mother had breast cancer in her 32s and her daughter had breast cancer in her 33s. Will consider genetic testing in the future.   Patient expressed understanding and was in agreement with this plan. She also understands that She can call clinic at any time with any questions, concerns, or complaints.    Lloyd Huger, MD   09/19/2015 8:59 AM

## 2015-09-18 ENCOUNTER — Encounter: Payer: Self-pay | Admitting: Oncology

## 2015-09-18 ENCOUNTER — Inpatient Hospital Stay: Payer: BLUE CROSS/BLUE SHIELD

## 2015-09-18 ENCOUNTER — Inpatient Hospital Stay (HOSPITAL_BASED_OUTPATIENT_CLINIC_OR_DEPARTMENT_OTHER): Payer: BLUE CROSS/BLUE SHIELD | Admitting: Oncology

## 2015-09-18 VITALS — BP 128/85 | HR 86 | Temp 97.5°F | Ht 61.1 in | Wt 105.3 lb

## 2015-09-18 DIAGNOSIS — Z803 Family history of malignant neoplasm of breast: Secondary | ICD-10-CM | POA: Diagnosis not present

## 2015-09-18 DIAGNOSIS — I10 Essential (primary) hypertension: Secondary | ICD-10-CM

## 2015-09-18 DIAGNOSIS — D693 Immune thrombocytopenic purpura: Secondary | ICD-10-CM

## 2015-09-18 DIAGNOSIS — Z79899 Other long term (current) drug therapy: Secondary | ICD-10-CM

## 2015-09-18 DIAGNOSIS — Z7951 Long term (current) use of inhaled steroids: Secondary | ICD-10-CM

## 2015-09-18 DIAGNOSIS — F329 Major depressive disorder, single episode, unspecified: Secondary | ICD-10-CM | POA: Diagnosis not present

## 2015-09-18 DIAGNOSIS — Z8781 Personal history of (healed) traumatic fracture: Secondary | ICD-10-CM | POA: Diagnosis not present

## 2015-09-18 DIAGNOSIS — Z87891 Personal history of nicotine dependence: Secondary | ICD-10-CM

## 2015-09-18 LAB — CBC WITH DIFFERENTIAL/PLATELET
BASOS ABS: 0 10*3/uL (ref 0–0.1)
Basophils Relative: 0 %
Eosinophils Absolute: 0.5 10*3/uL (ref 0–0.7)
HCT: 42.8 % (ref 35.0–47.0)
Hemoglobin: 14.1 g/dL (ref 12.0–16.0)
Lymphs Abs: 1.1 10*3/uL (ref 1.0–3.6)
MCH: 30.2 pg (ref 26.0–34.0)
MCHC: 32.9 g/dL (ref 32.0–36.0)
MCV: 91.9 fL (ref 80.0–100.0)
Monocytes Absolute: 0.5 10*3/uL (ref 0.2–0.9)
Monocytes Relative: 6 %
NEUTROS ABS: 6.6 10*3/uL — AB (ref 1.4–6.5)
PLATELETS: 42 10*3/uL — AB (ref 150–440)
RBC: 4.66 MIL/uL (ref 3.80–5.20)
RDW: 13.7 % (ref 11.5–14.5)
WBC: 8.8 10*3/uL (ref 3.6–11.0)

## 2015-09-18 MED ORDER — SODIUM CHLORIDE 0.9 % IV SOLN
375.0000 mg/m2 | Freq: Once | INTRAVENOUS | Status: AC
  Administered 2015-09-18: 500 mg via INTRAVENOUS
  Filled 2015-09-18: qty 50

## 2015-09-18 MED ORDER — SODIUM CHLORIDE 0.9 % IV SOLN: 375.0000 mg/m2 | Freq: Once | INTRAVENOUS | Status: DC

## 2015-09-18 MED ORDER — SODIUM CHLORIDE 0.9 % IV SOLN
Freq: Once | INTRAVENOUS | Status: AC
  Administered 2015-09-18: 09:00:00 via INTRAVENOUS
  Filled 2015-09-18: qty 1000

## 2015-09-18 MED ORDER — ACETAMINOPHEN 325 MG PO TABS
650.0000 mg | ORAL_TABLET | Freq: Once | ORAL | Status: AC
Start: 2015-09-18 — End: 2015-09-18
  Administered 2015-09-18: 650 mg via ORAL
  Filled 2015-09-18: qty 2

## 2015-09-18 MED ORDER — DIPHENHYDRAMINE HCL 25 MG PO CAPS
50.0000 mg | ORAL_CAPSULE | Freq: Once | ORAL | Status: AC
  Administered 2015-09-18: 50 mg via ORAL
  Filled 2015-09-18: qty 2

## 2015-09-18 NOTE — Progress Notes (Signed)
Patient here for treatment check. States she has no health concerns today.

## 2015-10-21 NOTE — Progress Notes (Signed)
[place]  Telephone:(336) [phone] Fax:(336) [phone]  ID: [name] OB: 1950/03/04  MR#: [id number]  JGG#:[id number]  Patient Care Team: Ellamae Sia, MD as PCP - General (Internal Medicine)  CHIEF COMPLAINT: ITP  INTERVAL HISTORY: Patient returns to clinic today for repeat laboratory work and further evaluation. She currently feels well and is asymptomatic. She denies any easy bleeding or bruising. She has a good appetite and denies weight loss. She has no neurologic complaints. She denies any recent fevers or illnesses. She has no chest pain or shortness of breath. She denies any nausea, vomiting, constipation, or diarrhea. She has no urinary complaints. Patient offers no specific complaints today.  REVIEW OF SYSTEMS:   Review of Systems  Constitutional: Negative.  Negative for fever, malaise/fatigue and weight loss.  Respiratory: Negative.  Negative for cough, hemoptysis and shortness of breath.   Cardiovascular: Negative.  Negative for chest pain.  Gastrointestinal: Negative.  Negative for abdominal pain and melena.  Genitourinary: Negative.  Negative for hematuria.  Musculoskeletal: Negative for joint pain.  Neurological: Negative.  Negative for weakness.  Endo/Heme/Allergies: Does not bruise/bleed easily.  Psychiatric/Behavioral: Negative.  The patient does not have insomnia.     As per HPI. Otherwise, a complete review of systems is negative.  PAST MEDICAL HISTORY: Past Medical History:  Diagnosis Date  . Allergy   . Depression   . Hypertension     PAST SURGICAL HISTORY: Past Surgical History:  Procedure Laterality Date  . CARPAL TUNNEL RELEASE    . TONSILLECTOMY Bilateral 1968    FAMILY HISTORY Family History  Problem Relation Age of Onset  . Breast cancer Mother     27's  . Breast cancer Daughter     17's   . Clotting disorder Daughter        ADVANCED DIRECTIVES:    HEALTH MAINTENANCE: Social History  Substance Use  Topics  . Smoking status: Former Smoker    Quit date: 04/19/2014  . Smokeless tobacco: Never Used  . Alcohol use Yes     Colonoscopy:  PAP:  Bone density:  Lipid panel:  No Known Allergies  Current Outpatient Prescriptions  Medication Sig Dispense Refill  . albuterol (PROAIR HFA) 108 (90 Base) MCG/ACT inhaler Inhale 1 puff into the lungs every 4 (four) hours as needed.    . cetirizine (ZYRTEC) 10 MG tablet Take 10 mg by mouth daily.    Marland Kitchen FLUoxetine (PROZAC) 40 MG capsule Take 80 mg by mouth daily.   2  . gabapentin (NEURONTIN) 400 MG capsule Take 400 mg by mouth 2 (two) times daily.   2  . hydrochlorothiazide (HYDRODIURIL) 25 MG tablet Reported on 07/21/2015  2  . nitroGLYCERIN (NITROSTAT) 0.4 MG SL tablet Place 1 tablet under the tongue every 5 (five) minutes as needed.    . traMADol (ULTRAM) 50 MG tablet Take 1 tablet by mouth every 6 (six) hours as needed.    . traZODone (DESYREL) 50 MG tablet Take 50 mg by mouth at bedtime as needed.   2   No current facility-administered medications for this visit.     OBJECTIVE: Vitals:   10/23/15 1148  BP: 130/87  Pulse: 74  Resp: 20  Temp: 97.3 F (36.3 C)     Body mass index is 19.8 kg/m.    ECOG FS:0 - Asymptomatic  General: Well-developed, well-nourished, no acute distress. Eyes: Pink conjunctiva, anicteric sclera. Lungs: Clear to auscultation bilaterally. Heart: Regular rate and rhythm. No rubs, murmurs, or gallops.  Abdomen: Soft, nontender, nondistended. No organomegaly noted, normoactive bowel sounds. Musculoskeletal: No edema, cyanosis, or clubbing. Neuro: Alert, answering all questions appropriately. Cranial nerves grossly intact. Skin: No rashes or petechiae noted. Psych: Normal affect.   LAB RESULTS:  No results found for: NA, K, CL, CO2, GLUCOSE, BUN, CREATININE, CALCIUM, PROT, ALBUMIN, AST, ALT, ALKPHOS, BILITOT, GFRNONAA, GFRAA  Lab Results  Component Value Date   WBC 8.9 10/23/2015   NEUTROABS 6.5 10/23/2015    HGB 14.5 10/23/2015   HCT 44.0 10/23/2015   MCV 91.6 10/23/2015   PLT 40 (L) 10/23/2015     STUDIES: No results found.  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 Previously, patient's laboratory work today other than her platelets were within normal limits. Her platelet count is essentially unchanged despite completing 4 weekly cycles of Rituxan on September 18, 2015. After lengthy discussion with the patient, she wishes to continue with treatment therefore will initiate Nplate injections in 1 week. Return to clinic on October 20th and November 2nd for lab and consideration of Nplate. Patient will then return to clinic on November 27, 2015 for further evaluation. 2. Broken ankle: Resolved.  3. Family history breast cancer: By report, patient's mother had breast cancer in her 23s and her daughter had breast cancer in her 31s. Will consider genetic testing in the future.   Approximately 30 minutes was spent in discussion of which greater than 50% was consultation.  Patient expressed understanding and was in agreement with this plan. She also understands that She can call clinic at any time with any questions, concerns, or complaints.    Lloyd Huger, MD   10/28/2015 1:56 PM

## 2015-10-23 ENCOUNTER — Inpatient Hospital Stay: Payer: BLUE CROSS/BLUE SHIELD | Attending: Oncology

## 2015-10-23 ENCOUNTER — Inpatient Hospital Stay (HOSPITAL_BASED_OUTPATIENT_CLINIC_OR_DEPARTMENT_OTHER): Payer: BLUE CROSS/BLUE SHIELD | Admitting: Oncology

## 2015-10-23 VITALS — BP 130/87 | HR 74 | Temp 97.3°F | Resp 20 | Wt 105.2 lb

## 2015-10-23 DIAGNOSIS — D693 Immune thrombocytopenic purpura: Secondary | ICD-10-CM

## 2015-10-23 DIAGNOSIS — Z87891 Personal history of nicotine dependence: Secondary | ICD-10-CM | POA: Insufficient documentation

## 2015-10-23 DIAGNOSIS — Z803 Family history of malignant neoplasm of breast: Secondary | ICD-10-CM | POA: Diagnosis not present

## 2015-10-23 DIAGNOSIS — Z79899 Other long term (current) drug therapy: Secondary | ICD-10-CM | POA: Diagnosis not present

## 2015-10-23 DIAGNOSIS — Z8781 Personal history of (healed) traumatic fracture: Secondary | ICD-10-CM | POA: Diagnosis not present

## 2015-10-23 DIAGNOSIS — F329 Major depressive disorder, single episode, unspecified: Secondary | ICD-10-CM

## 2015-10-23 DIAGNOSIS — I10 Essential (primary) hypertension: Secondary | ICD-10-CM

## 2015-10-23 LAB — CBC WITH DIFFERENTIAL/PLATELET
BASOS ABS: 0 10*3/uL (ref 0–0.1)
Basophils Relative: 1 %
EOS ABS: 0.5 10*3/uL (ref 0–0.7)
EOS PCT: 5 %
HCT: 44 % (ref 35.0–47.0)
Hemoglobin: 14.5 g/dL (ref 12.0–16.0)
LYMPHS ABS: 1.4 10*3/uL (ref 1.0–3.6)
LYMPHS PCT: 15 %
MCH: 30.2 pg (ref 26.0–34.0)
MCHC: 33 g/dL (ref 32.0–36.0)
MCV: 91.6 fL (ref 80.0–100.0)
MONO ABS: 0.5 10*3/uL (ref 0.2–0.9)
Monocytes Relative: 6 %
Neutro Abs: 6.5 10*3/uL (ref 1.4–6.5)
Neutrophils Relative %: 73 %
PLATELETS: 40 10*3/uL — AB (ref 150–440)
RBC: 4.81 MIL/uL (ref 3.80–5.20)
RDW: 14.3 % (ref 11.5–14.5)
WBC: 8.9 10*3/uL (ref 3.6–11.0)

## 2015-10-23 NOTE — Progress Notes (Signed)
Patient here today for follow up regarding thrombocytopenia. Patient denies any concerns today.  

## 2015-10-30 ENCOUNTER — Ambulatory Visit: Payer: BLUE CROSS/BLUE SHIELD

## 2015-10-30 ENCOUNTER — Inpatient Hospital Stay: Payer: BLUE CROSS/BLUE SHIELD

## 2015-10-30 ENCOUNTER — Encounter: Payer: Self-pay | Admitting: *Deleted

## 2015-10-30 DIAGNOSIS — D693 Immune thrombocytopenic purpura: Secondary | ICD-10-CM

## 2015-10-30 LAB — CBC WITH DIFFERENTIAL/PLATELET
Basophils Absolute: 0.1 10*3/uL (ref 0–0.1)
Basophils Relative: 1 %
Eosinophils Absolute: 0.5 10*3/uL (ref 0–0.7)
Eosinophils Relative: 5 %
HEMATOCRIT: 44 % (ref 35.0–47.0)
HEMOGLOBIN: 14.5 g/dL (ref 12.0–16.0)
LYMPHS ABS: 1.7 10*3/uL (ref 1.0–3.6)
LYMPHS PCT: 17 %
MCH: 30 pg (ref 26.0–34.0)
MCHC: 32.9 g/dL (ref 32.0–36.0)
MCV: 91.4 fL (ref 80.0–100.0)
Monocytes Absolute: 0.6 10*3/uL (ref 0.2–0.9)
Monocytes Relative: 6 %
NEUTROS PCT: 71 %
Neutro Abs: 7.2 10*3/uL — ABNORMAL HIGH (ref 1.4–6.5)
Platelets: 56 10*3/uL — ABNORMAL LOW (ref 150–440)
RBC: 4.82 MIL/uL (ref 3.80–5.20)
RDW: 13.8 % (ref 11.5–14.5)
WBC: 10 10*3/uL (ref 3.6–11.0)

## 2015-10-30 NOTE — Patient Instructions (Signed)
Per Dr. Grayland Ormond, nplate will be held on 10/20. Pt instructed to follow up at next appt on 11/2.

## 2015-11-12 ENCOUNTER — Inpatient Hospital Stay: Payer: BLUE CROSS/BLUE SHIELD | Attending: Oncology

## 2015-11-12 ENCOUNTER — Inpatient Hospital Stay: Payer: BLUE CROSS/BLUE SHIELD

## 2015-11-12 ENCOUNTER — Telehealth: Payer: Self-pay | Admitting: *Deleted

## 2015-11-12 DIAGNOSIS — F329 Major depressive disorder, single episode, unspecified: Secondary | ICD-10-CM | POA: Insufficient documentation

## 2015-11-12 DIAGNOSIS — Z79899 Other long term (current) drug therapy: Secondary | ICD-10-CM | POA: Insufficient documentation

## 2015-11-12 DIAGNOSIS — Z803 Family history of malignant neoplasm of breast: Secondary | ICD-10-CM | POA: Diagnosis not present

## 2015-11-12 DIAGNOSIS — I10 Essential (primary) hypertension: Secondary | ICD-10-CM | POA: Diagnosis not present

## 2015-11-12 DIAGNOSIS — D693 Immune thrombocytopenic purpura: Secondary | ICD-10-CM | POA: Insufficient documentation

## 2015-11-12 DIAGNOSIS — D72829 Elevated white blood cell count, unspecified: Secondary | ICD-10-CM | POA: Insufficient documentation

## 2015-11-12 LAB — CBC WITH DIFFERENTIAL/PLATELET
BASOS PCT: 1 %
Basophils Absolute: 0.1 10*3/uL (ref 0–0.1)
Eosinophils Absolute: 0.4 10*3/uL (ref 0–0.7)
Eosinophils Relative: 4 %
HEMATOCRIT: 45.6 % (ref 35.0–47.0)
HEMOGLOBIN: 14.9 g/dL (ref 12.0–16.0)
LYMPHS ABS: 1.7 10*3/uL (ref 1.0–3.6)
Lymphocytes Relative: 18 %
MCH: 29.8 pg (ref 26.0–34.0)
MCHC: 32.6 g/dL (ref 32.0–36.0)
MCV: 91.3 fL (ref 80.0–100.0)
MONO ABS: 0.5 10*3/uL (ref 0.2–0.9)
MONOS PCT: 5 %
NEUTROS ABS: 7.1 10*3/uL — AB (ref 1.4–6.5)
NEUTROS PCT: 72 %
Platelets: 58 10*3/uL — ABNORMAL LOW (ref 150–440)
RBC: 5 MIL/uL (ref 3.80–5.20)
RDW: 14 % (ref 11.5–14.5)
WBC: 9.7 10*3/uL (ref 3.6–11.0)

## 2015-11-12 NOTE — Telephone Encounter (Signed)
Patient came into [place] [facility] clinic this am for lab and possible NPlate. Platelets are 58 today. The pharmacist informed me that insurance isvwaiting on todays lab result before they authorize the NPlate. I informed patient that she can go home and wait for [place] to call her. Per patient I did call her with the platelet count. I assured her someone from the [facility] will call her back to tell her where and when she can come in for the NPlate injection. I spoke to her [place] who is aware and will call patient back.

## 2015-11-12 NOTE — Telephone Encounter (Signed)
Patient notified of platelet count that was drawn today, platelets 58. Per Dr. Grayland Ormond patient does not need Nplate at this time. Patient to keep follow up as scheduled on 11/17. Patient verbalized understanding of plan.

## 2015-11-26 NOTE — Progress Notes (Signed)
[place]  Telephone:(336) [phone] Fax:(336) [phone]  ID: Nancy Love OB: 1950/09/29  MR#: [id number]  GTX#:[id number]  Patient Care Team: Ellamae Sia, MD as PCP - General (Internal Medicine)  CHIEF COMPLAINT: ITP  INTERVAL HISTORY: Patient returns to clinic today for repeat laboratory work and further evaluation. She currently feels well and is asymptomatic. She denies any easy bleeding or bruising. She has a good appetite and denies weight loss. She has no neurologic complaints. She denies any recent fevers or illnesses. She has no chest pain or shortness of breath. She denies any nausea, vomiting, constipation, or diarrhea. She has no urinary complaints. Patient offers no specific complaints today.  REVIEW OF SYSTEMS:   Review of Systems  Constitutional: Negative.  Negative for fever, malaise/fatigue and weight loss.  Respiratory: Negative.  Negative for cough, hemoptysis and shortness of breath.   Cardiovascular: Negative.  Negative for chest pain.  Gastrointestinal: Negative.  Negative for abdominal pain and melena.  Genitourinary: Negative.  Negative for hematuria.  Musculoskeletal: Negative for joint pain.  Neurological: Negative.  Negative for weakness.  Endo/Heme/Allergies: Does not bruise/bleed easily.  Psychiatric/Behavioral: Negative.  The patient does not have insomnia.     As per HPI. Otherwise, a complete review of systems is negative.  PAST MEDICAL HISTORY: Past Medical History:  Diagnosis Date  . Allergy   . Depression   . Hypertension     PAST SURGICAL HISTORY: Past Surgical History:  Procedure Laterality Date  . CARPAL TUNNEL RELEASE    . TONSILLECTOMY Bilateral 1968    FAMILY HISTORY Family History  Problem Relation Age of Onset  . Breast cancer Mother     54's  . Breast cancer Daughter     75's   . Clotting disorder Daughter        ADVANCED DIRECTIVES:    HEALTH MAINTENANCE: Social History  Substance Use  Topics  . Smoking status: Former Smoker    Quit date: 04/19/2014  . Smokeless tobacco: Never Used  . Alcohol use Yes     Colonoscopy:  PAP:  Bone density:  Lipid panel:  No Known Allergies  Current Outpatient Prescriptions  Medication Sig Dispense Refill  . albuterol (PROAIR HFA) 108 (90 Base) MCG/ACT inhaler Inhale 1 puff into the lungs every 4 (four) hours as needed.    . cetirizine (ZYRTEC) 10 MG tablet Take 10 mg by mouth daily.    Marland Kitchen FLUoxetine (PROZAC) 40 MG capsule Take 80 mg by mouth daily.   2  . gabapentin (NEURONTIN) 400 MG capsule Take 400 mg by mouth 2 (two) times daily.   2  . nitroGLYCERIN (NITROSTAT) 0.4 MG SL tablet Place 1 tablet under the tongue every 5 (five) minutes as needed.    . traZODone (DESYREL) 150 MG tablet   0  . hydrochlorothiazide (HYDRODIURIL) 25 MG tablet Reported on 07/21/2015  2   No current facility-administered medications for this visit.     OBJECTIVE: Vitals:   11/27/15 1039  BP: 138/84  Pulse: 77  Temp: 98.2 F (36.8 C)     Body mass index is 20.06 kg/m.    ECOG FS:0 - Asymptomatic  General: Well-developed, well-nourished, no acute distress. Eyes: Pink conjunctiva, anicteric sclera. Lungs: Clear to auscultation bilaterally. Heart: Regular rate and rhythm. No rubs, murmurs, or gallops. Abdomen: Soft, nontender, nondistended. No organomegaly noted, normoactive bowel sounds. Musculoskeletal: No edema, cyanosis, or clubbing. Neuro: Alert, answering all questions appropriately. Cranial nerves grossly intact. Skin: No rashes or petechiae noted.  Psych: Normal affect.   LAB RESULTS:  No results found for: NA, K, CL, CO2, GLUCOSE, BUN, CREATININE, CALCIUM, PROT, ALBUMIN, AST, ALT, ALKPHOS, BILITOT, GFRNONAA, GFRAA  Lab Results  Component Value Date   WBC 13.2 (H) 11/27/2015   NEUTROABS 10.2 (H) 11/27/2015   HGB 14.8 11/27/2015   HCT 45.2 11/27/2015   MCV 91.6 11/27/2015   PLT 50 (L) 11/27/2015     STUDIES: No results  found.  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 Previously, patient's laboratory work today other than her platelets were within normal limits. Her platelet count is essentially unchanged despite completing 4 weekly cycles of Rituxan on September 18, 2015. Nplate injections have been denied by insurance. No intervention is needed at this time. Return to clinic every 4 weeks for laboratory work and then in 3 months for laboratory work and further evaluation.  2. Leukocytosis: Likely reactive, monitor. 3. Family history breast cancer: By report, patient's mother had breast cancer in her 11s and her daughter had breast cancer in her 39s. Will consider genetic testing in the future.   Approximately 30 minutes was spent in discussion of which greater than 50% was consultation.  Patient expressed understanding and was in agreement with this plan. She also understands that She can call clinic at any time with any questions, concerns, or complaints.    Lloyd Huger, MD   11/27/2015 11:00 AM

## 2015-11-27 ENCOUNTER — Inpatient Hospital Stay (HOSPITAL_BASED_OUTPATIENT_CLINIC_OR_DEPARTMENT_OTHER): Payer: BLUE CROSS/BLUE SHIELD | Admitting: Oncology

## 2015-11-27 ENCOUNTER — Inpatient Hospital Stay: Payer: BLUE CROSS/BLUE SHIELD

## 2015-11-27 VITALS — BP 138/84 | HR 77 | Temp 98.2°F | Ht 61.5 in | Wt 107.9 lb

## 2015-11-27 DIAGNOSIS — F329 Major depressive disorder, single episode, unspecified: Secondary | ICD-10-CM | POA: Diagnosis not present

## 2015-11-27 DIAGNOSIS — Z79899 Other long term (current) drug therapy: Secondary | ICD-10-CM

## 2015-11-27 DIAGNOSIS — D693 Immune thrombocytopenic purpura: Secondary | ICD-10-CM

## 2015-11-27 DIAGNOSIS — D72829 Elevated white blood cell count, unspecified: Secondary | ICD-10-CM

## 2015-11-27 DIAGNOSIS — I10 Essential (primary) hypertension: Secondary | ICD-10-CM

## 2015-11-27 LAB — CBC WITH DIFFERENTIAL/PLATELET
BASOS ABS: 0.2 10*3/uL — AB (ref 0–0.1)
Basophils Relative: 2 %
Eosinophils Absolute: 0.4 10*3/uL (ref 0–0.7)
Eosinophils Relative: 3 %
HEMATOCRIT: 45.2 % (ref 35.0–47.0)
HEMOGLOBIN: 14.8 g/dL (ref 12.0–16.0)
LYMPHS PCT: 15 %
Lymphs Abs: 1.9 10*3/uL (ref 1.0–3.6)
MCH: 30 pg (ref 26.0–34.0)
MCHC: 32.7 g/dL (ref 32.0–36.0)
MCV: 91.6 fL (ref 80.0–100.0)
MONO ABS: 0.5 10*3/uL (ref 0.2–0.9)
Monocytes Relative: 4 %
NEUTROS ABS: 10.2 10*3/uL — AB (ref 1.4–6.5)
NEUTROS PCT: 76 %
Platelets: 50 10*3/uL — ABNORMAL LOW (ref 150–440)
RBC: 4.93 MIL/uL (ref 3.80–5.20)
RDW: 14.1 % (ref 11.5–14.5)
WBC: 13.2 10*3/uL — ABNORMAL HIGH (ref 3.6–11.0)

## 2015-11-27 NOTE — Progress Notes (Signed)
Patient here for follow up no changes since last appointment 

## 2015-12-25 ENCOUNTER — Inpatient Hospital Stay: Payer: BLUE CROSS/BLUE SHIELD | Attending: Oncology

## 2015-12-25 DIAGNOSIS — D693 Immune thrombocytopenic purpura: Secondary | ICD-10-CM | POA: Insufficient documentation

## 2015-12-25 LAB — CBC WITH DIFFERENTIAL/PLATELET
Basophils Absolute: 0.1 10*3/uL (ref 0–0.1)
Basophils Relative: 1 %
EOS PCT: 8 %
Eosinophils Absolute: 0.6 10*3/uL (ref 0–0.7)
HEMATOCRIT: 45.8 % (ref 35.0–47.0)
HEMOGLOBIN: 14.8 g/dL (ref 12.0–16.0)
LYMPHS ABS: 1.6 10*3/uL (ref 1.0–3.6)
LYMPHS PCT: 20 %
MCH: 29.9 pg (ref 26.0–34.0)
MCHC: 32.4 g/dL (ref 32.0–36.0)
MCV: 92.3 fL (ref 80.0–100.0)
Monocytes Absolute: 0.3 10*3/uL (ref 0.2–0.9)
Monocytes Relative: 4 %
NEUTROS ABS: 5.2 10*3/uL (ref 1.4–6.5)
Neutrophils Relative %: 67 %
PLATELETS: 65 10*3/uL — AB (ref 150–440)
RBC: 4.96 MIL/uL (ref 3.80–5.20)
RDW: 13.8 % (ref 11.5–14.5)
WBC: 7.8 10*3/uL (ref 3.6–11.0)

## 2016-01-22 ENCOUNTER — Other Ambulatory Visit: Payer: BLUE CROSS/BLUE SHIELD

## 2016-02-17 NOTE — Progress Notes (Deleted)
[place]  Telephone:(336) [phone] Fax:(336) [phone]  ID: [name] OB: 1950-01-14  MR#: [id number]  EEF#:[id number]  Patient Care Team: Ellamae Sia, MD as PCP - General (Internal Medicine)  CHIEF COMPLAINT: ITP  INTERVAL HISTORY: Patient returns to clinic today for repeat laboratory work and further evaluation. She currently feels well and is asymptomatic. She denies any easy bleeding or bruising. She has a good appetite and denies weight loss. She has no neurologic complaints. She denies any recent fevers or illnesses. She has no chest pain or shortness of breath. She denies any nausea, vomiting, constipation, or diarrhea. She has no urinary complaints. Patient offers no specific complaints today.  REVIEW OF SYSTEMS:   Review of Systems  Constitutional: Negative.  Negative for fever, malaise/fatigue and weight loss.  Respiratory: Negative.  Negative for cough, hemoptysis and shortness of breath.   Cardiovascular: Negative.  Negative for chest pain.  Gastrointestinal: Negative.  Negative for abdominal pain and melena.  Genitourinary: Negative.  Negative for hematuria.  Musculoskeletal: Negative for joint pain.  Neurological: Negative.  Negative for weakness.  Endo/Heme/Allergies: Does not bruise/bleed easily.  Psychiatric/Behavioral: Negative.  The patient does not have insomnia.     As per HPI. Otherwise, a complete review of systems is negative.  PAST MEDICAL HISTORY: Past Medical History:  Diagnosis Date  . Allergy   . Depression   . Hypertension     PAST SURGICAL HISTORY: Past Surgical History:  Procedure Laterality Date  . CARPAL TUNNEL RELEASE    . TONSILLECTOMY Bilateral 1968    FAMILY HISTORY Family History  Problem Relation Age of Onset  . Breast cancer Mother     47's  . Breast cancer Daughter     27's   . Clotting disorder Daughter        ADVANCED DIRECTIVES:    HEALTH MAINTENANCE: Social History  Substance Use  Topics  . Smoking status: Former Smoker    Quit date: 04/19/2014  . Smokeless tobacco: Never Used  . Alcohol use Yes     Colonoscopy:  PAP:  Bone density:  Lipid panel:  No Known Allergies  Current Outpatient Prescriptions  Medication Sig Dispense Refill  . albuterol (PROAIR HFA) 108 (90 Base) MCG/ACT inhaler Inhale 1 puff into the lungs every 4 (four) hours as needed.    . cetirizine (ZYRTEC) 10 MG tablet Take 10 mg by mouth daily.    Marland Kitchen FLUoxetine (PROZAC) 40 MG capsule Take 80 mg by mouth daily.   2  . gabapentin (NEURONTIN) 400 MG capsule Take 400 mg by mouth 2 (two) times daily.   2  . hydrochlorothiazide (HYDRODIURIL) 25 MG tablet Reported on 07/21/2015  2  . nitroGLYCERIN (NITROSTAT) 0.4 MG SL tablet Place 1 tablet under the tongue every 5 (five) minutes as needed.    . traZODone (DESYREL) 150 MG tablet   0   No current facility-administered medications for this visit.     OBJECTIVE: There were no vitals filed for this visit.   There is no height or weight on file to calculate BMI.    ECOG FS:0 - Asymptomatic  General: Well-developed, well-nourished, no acute distress. Eyes: Pink conjunctiva, anicteric sclera. Lungs: Clear to auscultation bilaterally. Heart: Regular rate and rhythm. No rubs, murmurs, or gallops. Abdomen: Soft, nontender, nondistended. No organomegaly noted, normoactive bowel sounds. Musculoskeletal: No edema, cyanosis, or clubbing. Neuro: Alert, answering all questions appropriately. Cranial nerves grossly intact. Skin: No rashes or petechiae noted. Psych: Normal affect.   LAB  RESULTS:  No results found for: NA, K, CL, CO2, GLUCOSE, BUN, CREATININE, CALCIUM, PROT, ALBUMIN, AST, ALT, ALKPHOS, BILITOT, GFRNONAA, GFRAA  Lab Results  Component Value Date   WBC 7.8 12/25/2015   NEUTROABS 5.2 12/25/2015   HGB 14.8 12/25/2015   HCT 45.8 12/25/2015   MCV 92.3 12/25/2015   PLT 65 (L) 12/25/2015     STUDIES: No results found.  ASSESSMENT: ITP,  family history of breast cancer.  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 Previously, patient's laboratory work today other than her platelets were within normal limits. Her platelet count is essentially unchanged despite completing 4 weekly cycles of Rituxan on September 18, 2015. Nplate injections have been denied by insurance. No intervention is needed at this time. Return to clinic every 4 weeks for laboratory work and then in 3 months for laboratory work and further evaluation.  2. Leukocytosis: Likely reactive, monitor. 3. Family history breast cancer: By report, patient's mother had breast cancer in her 41s and her daughter had breast cancer in her 51s. Will consider genetic testing in the future.   Approximately 30 minutes was spent in discussion of which greater than 50% was consultation.  Patient expressed understanding and was in agreement with this plan. She also understands that She can call clinic at any time with any questions, concerns, or complaints.    Lloyd Huger, MD   02/17/2016 11:02 PM

## 2016-02-19 ENCOUNTER — Other Ambulatory Visit: Payer: BLUE CROSS/BLUE SHIELD

## 2016-02-19 ENCOUNTER — Inpatient Hospital Stay: Payer: Self-pay

## 2016-02-19 ENCOUNTER — Inpatient Hospital Stay: Payer: Self-pay | Admitting: Oncology

## 2016-03-09 NOTE — Progress Notes (Deleted)
[place]  Telephone:(336) [phone] Fax:(336) [phone]  ID: [name] OB: Jan 12, 1950  MR#: [id number]  HWK#:[id number]  Patient Care Team: Ellamae Sia, MD as PCP - General (Internal Medicine)  CHIEF COMPLAINT: ITP  INTERVAL HISTORY: Patient returns to clinic today for repeat laboratory work and further evaluation. She currently feels well and is asymptomatic. She denies any easy bleeding or bruising. She has a good appetite and denies weight loss. She has no neurologic complaints. She denies any recent fevers or illnesses. She has no chest pain or shortness of breath. She denies any nausea, vomiting, constipation, or diarrhea. She has no urinary complaints. Patient offers no specific complaints today.  REVIEW OF SYSTEMS:   Review of Systems  Constitutional: Negative.  Negative for fever, malaise/fatigue and weight loss.  Respiratory: Negative.  Negative for cough, hemoptysis and shortness of breath.   Cardiovascular: Negative.  Negative for chest pain.  Gastrointestinal: Negative.  Negative for abdominal pain and melena.  Genitourinary: Negative.  Negative for hematuria.  Musculoskeletal: Negative for joint pain.  Neurological: Negative.  Negative for weakness.  Endo/Heme/Allergies: Does not bruise/bleed easily.  Psychiatric/Behavioral: Negative.  The patient does not have insomnia.     As per HPI. Otherwise, a complete review of systems is negative.  PAST MEDICAL HISTORY: Past Medical History:  Diagnosis Date  . Allergy   . Depression   . Hypertension     PAST SURGICAL HISTORY: Past Surgical History:  Procedure Laterality Date  . CARPAL TUNNEL RELEASE    . TONSILLECTOMY Bilateral 1968    FAMILY HISTORY Family History  Problem Relation Age of Onset  . Breast cancer Mother     13's  . Breast cancer Daughter     43's   . Clotting disorder Daughter        ADVANCED DIRECTIVES:    HEALTH MAINTENANCE: Social History  Substance Use  Topics  . Smoking status: Former Smoker    Quit date: 04/19/2014  . Smokeless tobacco: Never Used  . Alcohol use Yes     Colonoscopy:  PAP:  Bone density:  Lipid panel:  No Known Allergies  Current Outpatient Prescriptions  Medication Sig Dispense Refill  . albuterol (PROAIR HFA) 108 (90 Base) MCG/ACT inhaler Inhale 1 puff into the lungs every 4 (four) hours as needed.    . cetirizine (ZYRTEC) 10 MG tablet Take 10 mg by mouth daily.    Marland Kitchen FLUoxetine (PROZAC) 40 MG capsule Take 80 mg by mouth daily.   2  . gabapentin (NEURONTIN) 400 MG capsule Take 400 mg by mouth 2 (two) times daily.   2  . hydrochlorothiazide (HYDRODIURIL) 25 MG tablet Reported on 07/21/2015  2  . nitroGLYCERIN (NITROSTAT) 0.4 MG SL tablet Place 1 tablet under the tongue every 5 (five) minutes as needed.    . traZODone (DESYREL) 150 MG tablet   0   No current facility-administered medications for this visit.     OBJECTIVE: There were no vitals filed for this visit.   There is no height or weight on file to calculate BMI.    ECOG FS:0 - Asymptomatic  General: Well-developed, well-nourished, no acute distress. Eyes: Pink conjunctiva, anicteric sclera. Lungs: Clear to auscultation bilaterally. Heart: Regular rate and rhythm. No rubs, murmurs, or gallops. Abdomen: Soft, nontender, nondistended. No organomegaly noted, normoactive bowel sounds. Musculoskeletal: No edema, cyanosis, or clubbing. Neuro: Alert, answering all questions appropriately. Cranial nerves grossly intact. Skin: No rashes or petechiae noted. Psych: Normal affect.   LAB  RESULTS:  No results found for: NA, K, CL, CO2, GLUCOSE, BUN, CREATININE, CALCIUM, PROT, ALBUMIN, AST, ALT, ALKPHOS, BILITOT, GFRNONAA, GFRAA  Lab Results  Component Value Date   WBC 7.8 12/25/2015   NEUTROABS 5.2 12/25/2015   HGB 14.8 12/25/2015   HCT 45.8 12/25/2015   MCV 92.3 12/25/2015   PLT 65 (L) 12/25/2015     STUDIES: No results found.  ASSESSMENT: ITP,  family history of breast cancer.  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 Previously, patient's laboratory work today other than her platelets were within normal limits. Her platelet count is essentially unchanged despite completing 4 weekly cycles of Rituxan on September 18, 2015. Nplate injections have been denied by insurance. No intervention is needed at this time. Return to clinic every 4 weeks for laboratory work and then in 3 months for laboratory work and further evaluation.  2. Leukocytosis: Likely reactive, monitor. 3. Family history breast cancer: By report, patient's mother had breast cancer in her 72s and her daughter had breast cancer in her 67s. Will consider genetic testing in the future.   Approximately 30 minutes was spent in discussion of which greater than 50% was consultation.  Patient expressed understanding and was in agreement with this plan. She also understands that She can call clinic at any time with any questions, concerns, or complaints.    Lloyd Huger, MD   03/09/2016 11:29 PM

## 2016-03-11 ENCOUNTER — Ambulatory Visit: Payer: Self-pay | Admitting: Oncology

## 2016-03-11 ENCOUNTER — Inpatient Hospital Stay: Payer: Self-pay

## 2016-03-11 ENCOUNTER — Other Ambulatory Visit: Payer: Self-pay

## 2017-05-08 ENCOUNTER — Other Ambulatory Visit: Payer: Self-pay | Admitting: Internal Medicine

## 2017-05-08 DIAGNOSIS — Z1231 Encounter for screening mammogram for malignant neoplasm of breast: Secondary | ICD-10-CM

## 2017-05-23 ENCOUNTER — Other Ambulatory Visit: Payer: Self-pay

## 2017-05-23 DIAGNOSIS — Z1211 Encounter for screening for malignant neoplasm of colon: Secondary | ICD-10-CM

## 2017-05-23 MED ORDER — PEG 3350-KCL-NABCB-NACL-NASULF 236 G PO SOLR: ORAL | 0 refills | Status: DC

## 2017-05-24 ENCOUNTER — Ambulatory Visit
Admission: RE | Admit: 2017-05-24 | Discharge: 2017-05-24 | Disposition: A | Discharge status: Home / Self Care | Payer: BLUE CROSS/BLUE SHIELD | Source: Ambulatory Visit | Attending: Internal Medicine | Admitting: Internal Medicine

## 2017-05-24 DIAGNOSIS — Z1231 Encounter for screening mammogram for malignant neoplasm of breast: Secondary | ICD-10-CM | POA: Diagnosis not present

## 2017-05-26 ENCOUNTER — Inpatient Hospital Stay: Payer: BLUE CROSS/BLUE SHIELD | Attending: Oncology | Admitting: Oncology

## 2017-05-26 ENCOUNTER — Other Ambulatory Visit: Payer: Self-pay

## 2017-05-26 ENCOUNTER — Inpatient Hospital Stay: Payer: BLUE CROSS/BLUE SHIELD

## 2017-05-26 VITALS — BP 106/74 | HR 76 | Temp 97.8°F | Resp 18 | Wt 109.7 lb

## 2017-05-26 DIAGNOSIS — Z79899 Other long term (current) drug therapy: Secondary | ICD-10-CM | POA: Diagnosis not present

## 2017-05-26 DIAGNOSIS — D696 Thrombocytopenia, unspecified: Secondary | ICD-10-CM

## 2017-05-26 DIAGNOSIS — Z1231 Encounter for screening mammogram for malignant neoplasm of breast: Secondary | ICD-10-CM | POA: Diagnosis not present

## 2017-05-26 DIAGNOSIS — F329 Major depressive disorder, single episode, unspecified: Secondary | ICD-10-CM | POA: Diagnosis not present

## 2017-05-26 DIAGNOSIS — D693 Immune thrombocytopenic purpura: Secondary | ICD-10-CM | POA: Diagnosis not present

## 2017-05-26 DIAGNOSIS — I10 Essential (primary) hypertension: Secondary | ICD-10-CM | POA: Insufficient documentation

## 2017-05-26 DIAGNOSIS — Z803 Family history of malignant neoplasm of breast: Secondary | ICD-10-CM | POA: Diagnosis not present

## 2017-05-26 DIAGNOSIS — Z87891 Personal history of nicotine dependence: Secondary | ICD-10-CM | POA: Diagnosis not present

## 2017-05-26 LAB — CBC WITH DIFFERENTIAL/PLATELET
BASOS ABS: 0.1 10*3/uL (ref 0–0.1)
BASOS PCT: 1 %
EOS ABS: 0.5 10*3/uL (ref 0–0.7)
Eosinophils Relative: 6 %
HCT: 42.2 % (ref 35.0–47.0)
Hemoglobin: 14 g/dL (ref 12.0–16.0)
Lymphocytes Relative: 13 %
Lymphs Abs: 1.1 10*3/uL (ref 1.0–3.6)
MCH: 30 pg (ref 26.0–34.0)
MCHC: 33.1 g/dL (ref 32.0–36.0)
MCV: 90.4 fL (ref 80.0–100.0)
Monocytes Absolute: 0.4 10*3/uL (ref 0.2–0.9)
Monocytes Relative: 5 %
NEUTROS PCT: 75 %
Neutro Abs: 6.4 10*3/uL (ref 1.4–6.5)
PLATELETS: 39 10*3/uL — AB (ref 150–440)
RBC: 4.67 MIL/uL (ref 3.80–5.20)
RDW: 13.6 % (ref 11.5–14.5)
WBC: 8.4 10*3/uL (ref 3.6–11.0)

## 2017-05-26 NOTE — Progress Notes (Signed)
Here for follow up. Per pt " doing really good " no c/o

## 2017-05-26 NOTE — Progress Notes (Signed)
[place]  [phone]) [phone] [phone]  ID: Nancy Love OB: [birth year]  MR#: [id number]  STM#:[id number]  Patient Care Team: Ellamae Sia, MD as PCP - General (Internal Medicine)  CHIEF COMPLAINT: ITP  INTERVAL HISTORY: Patient returns to clinic today for repeat laboratory work and further evaluation.  She continues to feel well and remains asymptomatic.  She denies any easy bleeding or bruising. She has a good appetite and denies weight loss. She has no neurologic complaints. She denies any recent fevers or illnesses. She has no chest pain or shortness of breath. She denies any nausea, vomiting, constipation, or diarrhea. She has no urinary complaints.  Patient offers no specific complaints today.  REVIEW OF SYSTEMS:   Review of Systems  Constitutional: Negative.  Negative for fever, malaise/fatigue and weight loss.  Respiratory: Negative.  Negative for cough, hemoptysis and shortness of breath.   Cardiovascular: Negative.  Negative for chest pain.  Gastrointestinal: Negative.  Negative for abdominal pain and melena.  Genitourinary: Negative.  Negative for hematuria.  Musculoskeletal: Negative.  Negative for joint pain.  Skin: Negative.  Negative for rash.  Neurological: Negative.  Negative for sensory change, focal weakness and weakness.  Endo/Heme/Allergies: Does not bruise/bleed easily.  Psychiatric/Behavioral: Negative.  The patient does not have insomnia.     As per HPI. Otherwise, a complete review of systems is negative.  PAST MEDICAL HISTORY: Past Medical History:  Diagnosis Date  . Allergy   . Depression   . Hypertension     PAST SURGICAL HISTORY: Past Surgical History:  Procedure Laterality Date  . CARPAL TUNNEL RELEASE    . TONSILLECTOMY Bilateral 1968    FAMILY HISTORY Family History  Problem Relation Age of Onset  . Breast cancer Mother        59's  . Breast cancer Daughter        59's   . Clotting disorder  Daughter        ADVANCED DIRECTIVES:    HEALTH MAINTENANCE: Social History   Tobacco Use  . Smoking status: Former Smoker    Last attempt to quit: 04/19/2014    Years since quitting: 3.1  . Smokeless tobacco: Never Used  Substance Use Topics  . Alcohol use: Yes  . Drug use: No     Colonoscopy:  PAP:  Bone density:  Lipid panel:  No Known Allergies  Current Outpatient Medications  Medication Sig Dispense Refill  . ARIPiprazole (ABILIFY) 5 MG tablet 5 mg daily.   1  . cetirizine (ZYRTEC) 10 MG tablet Take 10 mg by mouth daily.    Marland Kitchen FLUoxetine (PROZAC) 40 MG capsule Take 80 mg by mouth daily.   2  . gabapentin (NEURONTIN) 400 MG capsule Take 400 mg by mouth 2 (two) times daily.   2  . traZODone (DESYREL) 150 MG tablet   0  . albuterol (PROAIR HFA) 108 (90 Base) MCG/ACT inhaler Inhale 1 puff into the lungs every 4 (four) hours as needed.    . nitroGLYCERIN (NITROSTAT) 0.4 MG SL tablet Place 1 tablet under the tongue every 5 (five) minutes as needed.    . polyethylene glycol (GOLYTELY) 236 g solution Drink one 8 oz glass every 20 mins until entire container is finished (Patient not taking: Reported on 05/26/2017) 4000 mL 0   No current facility-administered medications for this visit.     OBJECTIVE: Vitals:   05/26/17 1113  BP: 106/74  Pulse: 76  Resp: 18  Temp: 97.8 F (36.6 C)  Body mass index is 20.39 kg/m.    ECOG FS:0 - Asymptomatic  General: Thin, no acute distress. Eyes: Pink conjunctiva, anicteric sclera. Lungs: Clear to auscultation bilaterally. Heart: Regular rate and rhythm. No rubs, murmurs, or gallops. Abdomen: Soft, nontender, nondistended. No organomegaly noted, normoactive bowel sounds. Musculoskeletal: No edema, cyanosis, or clubbing. Neuro: Alert, answering all questions appropriately. Cranial nerves grossly intact. Skin: No rashes or petechiae noted. Psych: Normal affect.   LAB RESULTS:  No results found for: NA, K, CL, CO2, GLUCOSE, BUN,  CREATININE, CALCIUM, PROT, ALBUMIN, AST, ALT, ALKPHOS, BILITOT, GFRNONAA, GFRAA  Lab Results  Component Value Date   WBC 8.4 05/26/2017   NEUTROABS 6.4 05/26/2017   HGB 14.0 05/26/2017   HCT 42.2 05/26/2017   MCV 90.4 05/26/2017   PLT 39 (L) 05/26/2017     STUDIES: Mm Digital Screening Bilateral  Result Date: 05/24/2017 CLINICAL DATA:  Screening. EXAM: DIGITAL SCREENING BILATERAL MAMMOGRAM WITH CAD COMPARISON:  Previous exam(s). ACR Breast Density Category b: There are scattered areas of fibroglandular density. FINDINGS: There are no findings suspicious for malignancy. Images were processed with CAD. IMPRESSION: No mammographic evidence of malignancy. A result letter of this screening mammogram will be mailed directly to the patient. RECOMMENDATION: Screening mammogram in one year. (Code:SM-B-01Y) BI-RADS CATEGORY  1: Negative. Electronically Signed   By: Lajean Manes M.D.   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 Previously, patient's laboratory work today other than her platelets were within normal limits.  Patient's platelet count has trended down, but she remains asymptomatic.  Previously, patient was unresponsive to prednisone and Rituxan. Nplate injections have been denied by insurance unless her platelet count falls below 20 or she has evidence of bleeding.  No intervention is needed at this time.  Return to clinic in 6 weeks for laboratory work and then in 3 months for laboratory work and further evaluation.  2. Family history breast cancer: By report, patient's mother had breast cancer in her 101s and her daughter had breast cancer in her 45s. Will consider genetic testing in the future.   Approximately 20 minutes spent in discussion of which greater than 50% was consultation.  Patient expressed understanding and was in agreement with this plan. She also understands that She can call clinic at  any time with any questions, concerns, or complaints.    Lloyd Huger, MD   05/27/2017 11:27 AM

## 2017-06-06 ENCOUNTER — Ambulatory Visit
Admission: RE | Admit: 2017-06-06 | Payer: BLUE CROSS/BLUE SHIELD | Source: Ambulatory Visit | Admitting: Gastroenterology

## 2017-06-06 ENCOUNTER — Encounter: Admission: RE | Payer: Self-pay | Source: Ambulatory Visit

## 2017-06-06 SURGERY — COLONOSCOPY WITH PROPOFOL
Anesthesia: General

## 2017-06-07 ENCOUNTER — Other Ambulatory Visit: Payer: Self-pay

## 2017-06-07 ENCOUNTER — Encounter: Payer: Self-pay | Admitting: Gastroenterology

## 2017-06-07 DIAGNOSIS — Z1211 Encounter for screening for malignant neoplasm of colon: Secondary | ICD-10-CM

## 2017-06-07 NOTE — Telephone Encounter (Signed)
Error

## 2017-06-19 ENCOUNTER — Telehealth: Payer: Self-pay

## 2017-06-19 NOTE — Telephone Encounter (Signed)
Patient contacted office to state that she preferred a female provider for her colonoscopy.  She was originally scheduled with Dr. Allen Norris at [facility].  She has been moved to 07/09 at [facility] with Dr. Bonna Gains. Debbie at [facility] has been informed.

## 2017-06-29 ENCOUNTER — Other Ambulatory Visit: Payer: Self-pay | Admitting: *Deleted

## 2017-06-29 DIAGNOSIS — D696 Thrombocytopenia, unspecified: Secondary | ICD-10-CM

## 2017-07-07 ENCOUNTER — Inpatient Hospital Stay: Payer: BLUE CROSS/BLUE SHIELD

## 2017-07-10 ENCOUNTER — Other Ambulatory Visit: Payer: Self-pay

## 2017-07-10 ENCOUNTER — Encounter: Payer: Self-pay | Admitting: *Deleted

## 2017-07-11 ENCOUNTER — Encounter: Payer: Self-pay | Admitting: Anesthesiology

## 2017-07-16 NOTE — Progress Notes (Signed)
[name]  Telephone:(336) [phone] [phone]) [phone]  ID: Nancy Love OB: January 16, 1950  MR#: [id number]  EXH#:[id number]  Patient Care Team: Ellamae Sia, MD as PCP - General (Internal Medicine)  CHIEF COMPLAINT: ITP  INTERVAL HISTORY: Patient returns to clinic today for repeat laboratory work and further evaluation.  She continues to feel well and remains asymptomatic.  She denies any easy bleeding or bruising.  She has no neurologic complaints.  She denies any recent fevers or illnesses.  She has a good appetite and denies weight loss. She has no chest pain or shortness of breath. She denies any nausea, vomiting, constipation, or diarrhea. She has no urinary complaints.  Patient feels at her baseline offers no specific complaints today.  REVIEW OF SYSTEMS:   Review of Systems  Constitutional: Negative.  Negative for fever, malaise/fatigue and weight loss.  Respiratory: Negative.  Negative for cough, hemoptysis and shortness of breath.   Cardiovascular: Negative.  Negative for chest pain.  Gastrointestinal: Negative.  Negative for abdominal pain and melena.  Genitourinary: Negative.  Negative for hematuria.  Musculoskeletal: Negative.  Negative for joint pain.  Skin: Negative.  Negative for rash.  Neurological: Negative.  Negative for sensory change, focal weakness, weakness and headaches.  Endo/Heme/Allergies: Does not bruise/bleed easily.  Psychiatric/Behavioral: Negative.  The patient does not have insomnia.     As per HPI. Otherwise, a complete review of systems is negative.  PAST MEDICAL HISTORY: Past Medical History:  Diagnosis Date  . Allergy   . Depression   . Hypertension    no longer needs meds  . Thrombocytopenia ([place])     PAST SURGICAL HISTORY: Past Surgical History:  Procedure Laterality Date  . CARPAL TUNNEL RELEASE    . TONSILLECTOMY Bilateral 1968    FAMILY HISTORY Family History  Problem Relation Age of Onset  . Breast  cancer Mother        76's  . Breast cancer Daughter        65's   . Clotting disorder Daughter        ADVANCED DIRECTIVES:    HEALTH MAINTENANCE: Social History   Tobacco Use  . Smoking status: Former Smoker    Last attempt to quit: 04/19/2014    Years since quitting: 3.2  . Smokeless tobacco: Never Used  Substance Use Topics  . Alcohol use: Yes    Comment: Holidays  . Drug use: No     Colonoscopy:  PAP:  Bone density:  Lipid panel:  No Known Allergies  Current Outpatient Medications  Medication Sig Dispense Refill  . albuterol (PROAIR HFA) 108 (90 Base) MCG/ACT inhaler Inhale 1 puff into the lungs every 4 (four) hours as needed.    . ARIPiprazole (ABILIFY) 5 MG tablet 5 mg daily.   1  . cetirizine (ZYRTEC) 10 MG tablet Take 10 mg by mouth daily.    Marland Kitchen FLUoxetine (PROZAC) 40 MG capsule Take 80 mg by mouth daily.   2  . gabapentin (NEURONTIN) 400 MG capsule Take 400 mg by mouth 2 (two) times daily.   2  . nitroGLYCERIN (NITROSTAT) 0.4 MG SL tablet Place 1 tablet under the tongue every 5 (five) minutes as needed.    . traZODone (DESYREL) 150 MG tablet   0  . polyethylene glycol (GOLYTELY) 236 g solution Drink one 8 oz glass every 20 mins until entire container is finished (Patient not taking: Reported on 05/26/2017) 4000 mL 0   No current facility-administered medications for this visit.  OBJECTIVE: Vitals:   07/21/17 0936  BP: 127/88  Pulse: 70  Resp: 18  Temp: 98.1 F (36.7 C)     Body mass index is 19.83 kg/m.    ECOG FS:0 - Asymptomatic  General: Well-developed, well-nourished, no acute distress. Eyes: Pink conjunctiva, anicteric sclera. HEENT: Normocephalic, moist mucous membranes, clear oropharnyx. Lungs: Clear to auscultation bilaterally. Heart: Regular rate and rhythm. No rubs, murmurs, or gallops. Abdomen: Soft, nontender, nondistended. No organomegaly noted, normoactive bowel sounds. Musculoskeletal: No edema, cyanosis, or clubbing. Neuro: Alert,  answering all questions appropriately. Cranial nerves grossly intact. Skin: No rashes or petechiae noted. Psych: Normal affect.  LAB RESULTS:  No results found for: NA, K, CL, CO2, GLUCOSE, BUN, CREATININE, CALCIUM, PROT, ALBUMIN, AST, ALT, ALKPHOS, BILITOT, GFRNONAA, GFRAA  Lab Results  Component Value Date   WBC 7.0 07/21/2017   NEUTROABS 4.8 07/21/2017   HGB 14.2 07/21/2017   HCT 43.5 07/21/2017   MCV 90.5 07/21/2017   PLT 53 (L) 07/21/2017     STUDIES: No results found.  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  PLAN:    1. 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  Patient's platelet count remains decreased at 53, which is essentially her baseline.  Previously, all of her other laboratory work was either negative or within normal limits.  Patient was unresponsive to prednisone and Rituxan. Nplate injections have been denied by insurance unless her platelet count falls below 20 or she has evidence of bleeding.  No intervention is needed at this time.  Return to clinic in 3 months for laboratory work and then in 6 months for laboratory work and further evaluation. 2. Family history breast cancer: By report, patient's mother had breast cancer in her 65s and her daughter had breast cancer in her 49s. Will consider genetic testing in the future.   3.  Heme positive stools: Patient has colonoscopy scheduled in the next 2 to 3 weeks.  I spent a total of 20 minutes face-to-face with the patient of which greater than 50% of the visit was spent in counseling and coordination of care as detailed above.   Patient expressed understanding and was in agreement with this plan. She also understands that She can call clinic at any time with any questions, concerns, or complaints.    Lloyd Huger, MD   07/27/2017 1:29 PM

## 2017-07-21 ENCOUNTER — Inpatient Hospital Stay: Payer: BLUE CROSS/BLUE SHIELD | Attending: Oncology

## 2017-07-21 ENCOUNTER — Inpatient Hospital Stay (HOSPITAL_BASED_OUTPATIENT_CLINIC_OR_DEPARTMENT_OTHER): Payer: BLUE CROSS/BLUE SHIELD | Admitting: Oncology

## 2017-07-21 ENCOUNTER — Encounter: Payer: Self-pay | Admitting: Oncology

## 2017-07-21 VITALS — BP 127/88 | HR 70 | Temp 98.1°F | Resp 18 | Wt 106.7 lb

## 2017-07-21 DIAGNOSIS — Z803 Family history of malignant neoplasm of breast: Secondary | ICD-10-CM

## 2017-07-21 DIAGNOSIS — F329 Major depressive disorder, single episode, unspecified: Secondary | ICD-10-CM

## 2017-07-21 DIAGNOSIS — I10 Essential (primary) hypertension: Secondary | ICD-10-CM | POA: Insufficient documentation

## 2017-07-21 DIAGNOSIS — R195 Other fecal abnormalities: Secondary | ICD-10-CM | POA: Diagnosis not present

## 2017-07-21 DIAGNOSIS — Z79899 Other long term (current) drug therapy: Secondary | ICD-10-CM

## 2017-07-21 DIAGNOSIS — Z87891 Personal history of nicotine dependence: Secondary | ICD-10-CM

## 2017-07-21 DIAGNOSIS — D693 Immune thrombocytopenic purpura: Secondary | ICD-10-CM

## 2017-07-21 DIAGNOSIS — D696 Thrombocytopenia, unspecified: Secondary | ICD-10-CM

## 2017-07-21 LAB — CBC WITH DIFFERENTIAL/PLATELET
BASOS ABS: 0 10*3/uL (ref 0–0.1)
BASOS PCT: 1 %
Eosinophils Absolute: 0.5 10*3/uL (ref 0–0.7)
Eosinophils Relative: 8 %
HEMATOCRIT: 43.5 % (ref 35.0–47.0)
HEMOGLOBIN: 14.2 g/dL (ref 12.0–16.0)
Lymphocytes Relative: 18 %
Lymphs Abs: 1.3 10*3/uL (ref 1.0–3.6)
MCH: 29.6 pg (ref 26.0–34.0)
MCHC: 32.7 g/dL (ref 32.0–36.0)
MCV: 90.5 fL (ref 80.0–100.0)
MONOS PCT: 5 %
Monocytes Absolute: 0.3 10*3/uL (ref 0.2–0.9)
NEUTROS ABS: 4.8 10*3/uL (ref 1.4–6.5)
NEUTROS PCT: 68 %
Platelets: 53 10*3/uL — ABNORMAL LOW (ref 150–440)
RBC: 4.81 MIL/uL (ref 3.80–5.20)
RDW: 13.7 % (ref 11.5–14.5)
WBC: 7 10*3/uL (ref 3.6–11.0)

## 2017-07-21 NOTE — Progress Notes (Signed)
Pt reports doing a hemocult stool test and was notified some blood was noted.  Scheduled for colonoscopy in 2 weeks. Pt has "bug bites on forehead that have open and scabbed lesions.

## 2017-09-07 ENCOUNTER — Other Ambulatory Visit: Payer: Self-pay

## 2017-09-14 NOTE — Discharge Instructions (Signed)
General Anesthesia, Adult, Care After °These instructions provide you with information about caring for yourself after your procedure. Your health care provider may also give you more specific instructions. Your treatment has been planned according to current medical practices, but problems sometimes occur. Call your health care provider if you have any problems or questions after your procedure. °What can I expect after the procedure? °After the procedure, it is common to have: °· Vomiting. °· A sore throat. °· Mental slowness. ° °It is common to feel: °· Nauseous. °· Cold or shivery. °· Sleepy. °· Tired. °· Sore or achy, even in parts of your body where you did not have surgery. ° °Follow these instructions at home: °For at least 24 hours after the procedure: °· Do not: °? Participate in activities where you could fall or become injured. °? Drive. °? Use heavy machinery. °? Drink alcohol. °? Take sleeping pills or medicines that cause drowsiness. °? Make important decisions or sign legal documents. °? Take care of children on your own. °· Rest. °Eating and drinking °· If you vomit, drink water, juice, or soup when you can drink without vomiting. °· Drink enough fluid to keep your urine clear or pale yellow. °· Make sure you have little or no nausea before eating solid foods. °· Follow the diet recommended by your health care provider. °General instructions °· Have a responsible adult stay with you until you are awake and alert. °· Return to your normal activities as told by your health care provider. Ask your health care provider what activities are safe for you. °· Take over-the-counter and prescription medicines only as told by your health care provider. °· If you smoke, do not smoke without supervision. °· Keep all follow-up visits as told by your health care provider. This is important. °Contact a health care provider if: °· You continue to have nausea or vomiting at home, and medicines are not helpful. °· You  cannot drink fluids or start eating again. °· You cannot urinate after 8-12 hours. °· You develop a skin rash. °· You have fever. °· You have increasing redness at the site of your procedure. °Get help right away if: °· You have difficulty breathing. °· You have chest pain. °· You have unexpected bleeding. °· You feel that you are having a life-threatening or urgent problem. °This information is not intended to replace advice given to you by your health care provider. Make sure you discuss any questions you have with your health care provider. °Document Released: 04/04/2000 Document Revised: 06/01/2015 Document Reviewed: 12/11/2014 °Elsevier Interactive Patient Education © 2018 [employer] ° °

## 2017-09-19 ENCOUNTER — Ambulatory Visit: Payer: BLUE CROSS/BLUE SHIELD | Admitting: Anesthesiology

## 2017-09-19 ENCOUNTER — Ambulatory Visit
Admission: RE | Disposition: A | Discharge status: Home / Self Care | Payer: Self-pay | Source: Ambulatory Visit | Attending: Gastroenterology

## 2017-09-19 ENCOUNTER — Ambulatory Visit
Admission: RE | Admit: 2017-09-19 | Discharge: 2017-09-19 | Disposition: A | Discharge status: Home / Self Care | Payer: BLUE CROSS/BLUE SHIELD | Source: Ambulatory Visit | Attending: Gastroenterology | Admitting: Gastroenterology

## 2017-09-19 DIAGNOSIS — Z79899 Other long term (current) drug therapy: Secondary | ICD-10-CM | POA: Diagnosis not present

## 2017-09-19 DIAGNOSIS — I10 Essential (primary) hypertension: Secondary | ICD-10-CM | POA: Diagnosis not present

## 2017-09-19 DIAGNOSIS — Z87891 Personal history of nicotine dependence: Secondary | ICD-10-CM | POA: Diagnosis not present

## 2017-09-19 DIAGNOSIS — Z1211 Encounter for screening for malignant neoplasm of colon: Secondary | ICD-10-CM | POA: Insufficient documentation

## 2017-09-19 DIAGNOSIS — D125 Benign neoplasm of sigmoid colon: Secondary | ICD-10-CM | POA: Diagnosis not present

## 2017-09-19 DIAGNOSIS — R195 Other fecal abnormalities: Secondary | ICD-10-CM

## 2017-09-19 DIAGNOSIS — F329 Major depressive disorder, single episode, unspecified: Secondary | ICD-10-CM | POA: Insufficient documentation

## 2017-09-19 DIAGNOSIS — D127 Benign neoplasm of rectosigmoid junction: Secondary | ICD-10-CM | POA: Diagnosis not present

## 2017-09-19 DIAGNOSIS — K649 Unspecified hemorrhoids: Secondary | ICD-10-CM | POA: Diagnosis not present

## 2017-09-19 DIAGNOSIS — D122 Benign neoplasm of ascending colon: Secondary | ICD-10-CM | POA: Diagnosis not present

## 2017-09-19 HISTORY — DX: Thrombocytopenia, unspecified: D69.6

## 2017-09-19 HISTORY — PX: POLYPECTOMY: SHX5525

## 2017-09-19 HISTORY — PX: COLONOSCOPY WITH PROPOFOL: SHX5780

## 2017-09-19 SURGERY — COLONOSCOPY WITH PROPOFOL
Anesthesia: General | Site: Rectum

## 2017-09-19 MED ORDER — LACTATED RINGERS IV SOLN
1000.0000 mL | INTRAVENOUS | Status: DC
  Administered 2017-09-19: 1000 mL via INTRAVENOUS
  Administered 2017-09-19: 09:00:00 via INTRAVENOUS

## 2017-09-19 MED ORDER — STERILE WATER FOR IRRIGATION IR SOLN
Status: DC | PRN
  Administered 2017-09-19: 09:00:00

## 2017-09-19 MED ORDER — PROPOFOL 10 MG/ML IV BOLUS
INTRAVENOUS | Status: DC | PRN
  Administered 2017-09-19 (×7): 10 mg via INTRAVENOUS
  Administered 2017-09-19: 20 mg via INTRAVENOUS
  Administered 2017-09-19: 10 mg via INTRAVENOUS
  Administered 2017-09-19 (×2): 20 mg via INTRAVENOUS
  Administered 2017-09-19: 10 mg via INTRAVENOUS
  Administered 2017-09-19 (×3): 20 mg via INTRAVENOUS
  Administered 2017-09-19: 10 mg via INTRAVENOUS
  Administered 2017-09-19: 20 mg via INTRAVENOUS
  Administered 2017-09-19: 10 mg via INTRAVENOUS
  Administered 2017-09-19: 20 mg via INTRAVENOUS
  Administered 2017-09-19: 10 mg via INTRAVENOUS
  Administered 2017-09-19: 30 mg via INTRAVENOUS
  Administered 2017-09-19 (×5): 10 mg via INTRAVENOUS
  Administered 2017-09-19: 70 mg via INTRAVENOUS
  Administered 2017-09-19 (×4): 10 mg via INTRAVENOUS
  Administered 2017-09-19: 20 mg via INTRAVENOUS
  Administered 2017-09-19 (×6): 10 mg via INTRAVENOUS
  Administered 2017-09-19: 20 mg via INTRAVENOUS
  Administered 2017-09-19 (×2): 10 mg via INTRAVENOUS
  Administered 2017-09-19 (×2): 20 mg via INTRAVENOUS
  Administered 2017-09-19: 10 mg via INTRAVENOUS
  Administered 2017-09-19: 20 mg via INTRAVENOUS
  Administered 2017-09-19: 10 mg via INTRAVENOUS

## 2017-09-19 MED ORDER — OXYCODONE HCL 5 MG PO TABS: 5.0000 mg | ORAL_TABLET | Freq: Once | ORAL | Status: DC | PRN

## 2017-09-19 MED ORDER — LIDOCAINE HCL (CARDIAC) PF 100 MG/5ML IV SOSY
PREFILLED_SYRINGE | INTRAVENOUS | Status: DC | PRN
  Administered 2017-09-19: 100 mg via INTRAVENOUS

## 2017-09-19 MED ORDER — OXYCODONE HCL 5 MG/5ML PO SOLN: 5.0000 mg | Freq: Once | ORAL | Status: DC | PRN

## 2017-09-19 SURGICAL SUPPLY — 27 items
CANISTER SUCT 1200ML W/VALVE (MISCELLANEOUS) ×3 IMPLANT
CLIP HMST 235XBRD CATH ROT (MISCELLANEOUS) ×3 IMPLANT
CLIP RESOLUTION 360 11X235 (MISCELLANEOUS) ×6
ELECT REM PT RETURN 9FT ADLT (ELECTROSURGICAL) ×3
ELECTRODE REM PT RTRN 9FT ADLT (ELECTROSURGICAL) ×1 IMPLANT
ELEVIEW  INJECTABLE COMP 10 (MISCELLANEOUS) ×2
FCP ESCP3.2XJMB 240X2.8X (MISCELLANEOUS)
FORCEPS BIOP RAD 4 LRG CAP 4 (CUTTING FORCEPS) ×3 IMPLANT
FORCEPS BIOP RJ4 240 W/NDL (MISCELLANEOUS)
FORCEPS ESCP3.2XJMB 240X2.8X (MISCELLANEOUS) IMPLANT
GAUZE SPONGE 4X4 12PLY STRL (GAUZE/BANDAGES/DRESSINGS) ×3 IMPLANT
GOWN CVR UNV OPN BCK APRN NK (MISCELLANEOUS) ×2 IMPLANT
GOWN ISOL THUMB LOOP REG UNIV (MISCELLANEOUS) ×4
INJECTABLE ELEVIEW COMP 10 (MISCELLANEOUS) ×1 IMPLANT
INJECTOR VARIJECT VIN23 (MISCELLANEOUS) ×1 IMPLANT
KIT DEFENDO VALVE AND CONN (KITS) IMPLANT
KIT ENDO PROCEDURE OLY (KITS) ×3 IMPLANT
PROBE APC STR FIRE (PROBE) IMPLANT
RETRIEVER NET ROTH 2.5X230 LF (MISCELLANEOUS) IMPLANT
SNARE COLD EXACTO (MISCELLANEOUS) ×3 IMPLANT
SNARE SHORT THROW 13M SML OVAL (MISCELLANEOUS) IMPLANT
SNARE SHORT THROW 30M LRG OVAL (MISCELLANEOUS) ×3 IMPLANT
SNARE SNG USE RND 15MM (INSTRUMENTS) IMPLANT
SNARE SPIRAL (MISCELLANEOUS) ×3 IMPLANT
TRAP ETRAP POLY (MISCELLANEOUS) ×3 IMPLANT
VARIJECT INJECTOR VIN23 (MISCELLANEOUS) ×3
WATER STERILE IRR 250ML POUR (IV SOLUTION) ×3 IMPLANT

## 2017-09-19 NOTE — Anesthesia Postprocedure Evaluation (Signed)
Anesthesia Post Note  Patient: Nancy Love  Procedure(s) Performed: COLONOSCOPY WITH PROPOFOL WITH BIOPSIES (N/A Rectum) POLYPECTOMY (N/A Rectum)  Patient location during evaluation: PACU Anesthesia Type: General Level of consciousness: awake and alert Pain management: pain level controlled Vital Signs Assessment: post-procedure vital signs reviewed and stable Respiratory status: spontaneous breathing Cardiovascular status: blood pressure returned to baseline Anesthetic complications: no    Jaci Standard, III,  Vercie Pokorny D

## 2017-09-19 NOTE — Anesthesia Procedure Notes (Signed)
Procedure Name: MAC Date/Time: 09/19/2017 9:17 AM Performed by: Lind Guest, CRNA Pre-anesthesia Checklist: Emergency Drugs available, Suction available, Patient identified, Patient being monitored and Timeout performed Patient Re-evaluated:Patient Re-evaluated prior to induction Oxygen Delivery Method: Nasal cannula

## 2017-09-19 NOTE — Op Note (Addendum)
[facility] Gastroenterology Patient Name: Nancy Love Procedure Date: 09/19/2017 9:05 AM MRN: [id number] Account #: [id number] Date of Birth: 1951-01-07 Admit Type: Outpatient Age: 67 Room: [facility] OR ROOM 01 Gender: Female Note Status: Finalized Procedure:            Colonoscopy Indications:          Heme positive stool Providers:            Ronisha Herringshaw B. Bonna Gains MD, MD Referring MD:         Remus Blake MD, MD (Referring MD) Medicines:            Monitored Anesthesia Care Complications:        No immediate complications. Procedure:            Pre-Anesthesia Assessment:                       - ASA Grade Assessment: II - A patient with mild                        systemic disease.                       - Prior to the procedure, a History and Physical was                        performed, and patient medications, allergies and                        sensitivities were reviewed. The patient's tolerance of                        previous anesthesia was reviewed.                       - The risks and benefits of the procedure and the                        sedation options and risks were discussed with the                        patient. All questions were answered and informed                        consent was obtained.                       - Patient identification and proposed procedure were                        verified prior to the procedure by the physician, the                        nurse, the anesthesiologist, the anesthetist and the                        technician. The procedure was verified in the procedure                        room.  After obtaining informed consent, the colonoscope was                        passed under direct vision. Throughout the procedure,                        the patient's blood pressure, pulse, and oxygen                        saturations were monitored continuously. The   Colonoscope was introduced through the anus and                        advanced to the the cecum, identified by appendiceal                        orifice and ileocecal valve. The colonoscopy was                        performed with ease. The patient tolerated the                        procedure well. The quality of the bowel preparation                        was fair. Findings:      Hemorrhoids were found on perianal exam.      A 7 mm polyp was found in the ascending colon. The polyp was flat. Area       was successfully injected with saline for lesion assessment, and this       injection appeared to lift the lesion adequately. The polyp was removed       with a saline injection-lift technique using a hot snare. Resection and       retrieval were complete. To prevent bleeding post-intervention, one       hemostatic clip was successfully placed. There was no bleeding at the       end of the procedure.      A 11 mm polyp was found in the ascending colon. The polyp was flat. Area       was successfully injected with Eleview for lesion assessment, and this       injection appeared to lift the lesion adequately. Polypectomy was       attempted, initially using a hot snare. Polyp resection was incomplete       with this device. This intervention then required a different device and       polypectomy technique. The polyp was removed with a cold snare.       Resection and retrieval were complete. To prevent bleeding       post-intervention, one hemostatic clip was successfully placed. There       was no bleeding at the end of the procedure.      A 7 mm polyp was found in the ascending colon. The polyp was flat.       Polypectomy was not attempted due to poor endoscopic visualization.      A 7 mm polyp was found at 50 cm proximal to the anus. The polyp was       flat. The polyp was removed with a cold snare. Resection and retrieval       were complete. To prevent  bleeding post-intervention,  one hemostatic       clip was successfully placed. There was no bleeding at the end of the       procedure.      Two flat polyps were found in the recto-sigmoid colon. The polyps were 4       to 6 mm in size. These polyps were removed with a cold snare. Resection       and retrieval were complete.      A 4 mm polyp was found in the recto-sigmoid colon. The polyp was       sessile. The polyp was removed with a cold biopsy forceps. Resection and       retrieval were complete.      The exam was otherwise without abnormality.      The rectum, sigmoid colon, descending colon, transverse colon, ascending       colon and cecum appeared normal.      Other hyperplastic appearing polyps seen in the rectum. The largest ones       were removed and placed in the rectosigmoid jar as detailed above.      The retroflexed view of the distal rectum and anal verge was normal and       showed no anal or rectal abnormalities otherwise besides one polyp seen       on retroflexion and removed via biopsy forceps as detailed above.      Due to flat polyps and their location at angulations, need for       submucosal injections for visualization and clipping, the procedure took       a longer than usual time to complete. Impression:           - Preparation of the colon was fair.                       - Hemorrhoids found on perianal exam.                       - One 7 mm polyp in the ascending colon, removed using                        injection-lift and a hot snare. Resected and retrieved.                        Injected. Clip was placed.                       - One 11 mm polyp in the ascending colon, removed with                        a cold snare. Resected and retrieved. Injected. Clip                        was placed.                       - One 7 mm polyp in the ascending colon. Resection not                        attempted. (As multiple polyps were removed in the area  with snare  and lifting and clipping, retroflexion in                        the area would risk distrubing the clips and risk                        complications and thus retroflexion was not done. The                        polyp was present distal the ileocecal valve, and just                        distal the second ascending colon polyp removed).                       - One 7 mm polyp at 50 cm proximal to the anus, removed                        with a cold snare. Resected and retrieved. Clip was                        placed.                       - Two 4 to 6 mm polyps at the recto-sigmoid colon,                        removed with a cold snare. Resected and retrieved.                       - One 4 mm polyp at the recto-sigmoid colon, removed                        with a cold biopsy forceps. Resected and retrieved.                       - The examination was otherwise normal.                       - The rectum, sigmoid colon, descending colon,                        transverse colon, ascending colon and cecum are normal.                       - The distal rectum and anal verge are normal on                        retroflexion view. Recommendation:       - Discharge patient to home (with escort).                       - Advance diet as tolerated.                       - Continue present medications.                       - Await pathology results.                       -  Repeat colonoscopy within 3 months for surveillance                        with 2 day prep.                       - The findings and recommendations were discussed with                        the patient.                       - The findings and recommendations were discussed with                        the patient's family.                       - Return to primary care physician as previously                        scheduled.                       - Return to my office in 2 weeks. Procedure Code(s):    --- Professional  ---                       [phone], Colonoscopy, flexible; with removal of tumor(s),                        polyp(s), or other lesion(s) by snare technique                       45381, Colonoscopy, flexible; with directed submucosal                        injection(s), any substance                       12751, 59, Colonoscopy, flexible; with biopsy, single                        or multiple Diagnosis Code(s):    --- Professional ---                       K64.9, Unspecified hemorrhoids                       D12.2, Benign neoplasm of ascending colon                       D12.7, Benign neoplasm of rectosigmoid junction                       R19.5, Other fecal abnormalities CPT copyright 2017 American Medical Association. All rights reserved. The codes documented in this report are preliminary and upon coder review may  be revised to meet current compliance requirements.  Vonda Antigua, MD Margretta Sidle B. Bonna Gains MD, MD 09/19/2017 10:51:39 AM This report has been signed electronically. Number of Addenda: 0 Note Initiated On: 09/19/2017 9:05 AM Scope Withdrawal Time: 1 hour 8 minutes 18 seconds  Total Procedure Duration: 1 hour 16 minutes 15 seconds  Estimated Blood  Loss: Estimated blood loss: none.      [facility]

## 2017-09-19 NOTE — H&P (Signed)
Nancy Antigua, MD [address] [place], [place], [place], [address] [address], [place], [place], [place], [address] Phone: [phone]  Fax: [phone]  Primary Care Physician:  Ellamae Sia, MD   Pre-Procedure History & Physical: HPI:  Nancy Love is a 67 y.o. female is here for a colonoscopy.   Past Medical History:  Diagnosis Date  . Allergy   . Depression   . Hypertension    no longer needs meds  . Thrombocytopenia ([place])     Past Surgical History:  Procedure Laterality Date  . CARPAL TUNNEL RELEASE    . TONSILLECTOMY Bilateral 1968    Prior to Admission medications   Medication Sig Start Date End Date Taking? Authorizing Provider  ARIPiprazole (ABILIFY) 5 MG tablet 5 mg daily.  04/26/17  Yes [provider]  cetirizine (ZYRTEC) 10 MG tablet Take 10 mg by mouth daily.   Yes [provider]  FLUoxetine (PROZAC) 40 MG capsule Take 80 mg by mouth daily.  05/20/15  Yes [provider]  gabapentin (NEURONTIN) 400 MG capsule Take 400 mg by mouth 2 (two) times daily.  03/16/15  Yes [provider]  traZODone (DESYREL) 150 MG tablet  11/03/15  Yes [provider]  albuterol (PROAIR HFA) 108 (90 Base) MCG/ACT inhaler Inhale 1 puff into the lungs every 4 (four) hours as needed. 05/27/11   [provider]  nitroGLYCERIN (NITROSTAT) 0.4 MG SL tablet Place 1 tablet under the tongue every 5 (five) minutes as needed. 11/13/07   [provider]  polyethylene glycol (GOLYTELY) 236 g solution Drink one 8 oz glass every 20 mins until entire container is finished Patient not taking: Reported on 05/26/2017 05/23/17   Virgel Manifold, MD    Allergies as of 06/07/2017  . (No Known Allergies)    Family History  Problem Relation Age of Onset  . Breast cancer Mother        49's  . Breast cancer Daughter        60's   . Clotting disorder Daughter     Social History   Socioeconomic History  . Marital  status: Widowed    Spouse name: Not on file  . Number of children: Not on file  . Years of education: Not on file  . Highest education level: Not on file  Occupational History  . Not on file  Social Needs  . Financial resource strain: Not on file  . Food insecurity:    Worry: Not on file    Inability: Not on file  . Transportation needs:    Medical: Not on file    Non-medical: Not on file  Tobacco Use  . Smoking status: Former Smoker    Last attempt to quit: 04/19/2014    Years since quitting: 3.4  . Smokeless tobacco: Never Used  Substance and Sexual Activity  . Alcohol use: Yes    Comment: Holidays  . Drug use: No  . Sexual activity: Not on file  Lifestyle  . Physical activity:    Days per week: Not on file    Minutes per session: Not on file  . Stress: Not on file  Relationships  . Social connections:    Talks on phone: Not on file    Gets together: Not on file    Attends religious service: Not on file    Active member of club or organization: Not on file    Attends meetings of clubs or organizations: Not on file  Relationship status: Not on file  . Intimate partner violence:    Fear of current or ex partner: Not on file    Emotionally abused: Not on file    Physically abused: Not on file    Forced sexual activity: Not on file  Other Topics Concern  . Not on file  Social History Narrative  . Not on file    Review of Systems: See HPI, otherwise negative ROS  Physical Exam: BP 118/85   Pulse 78   Temp 97.9 F (36.6 C)   Ht 5' 1.5" (1.562 m)   Wt 47.6 kg   SpO2 97%   BMI 19.52 kg/m  General:   Alert,  pleasant and cooperative in NAD Head:  Normocephalic and atraumatic. Neck:  Supple; no masses or thyromegaly. Lungs:  Clear throughout to auscultation, normal respiratory effort.    Heart:  +S1, +S2, Regular rate and rhythm, No edema. Abdomen:  Soft, nontender and nondistended. Normal bowel sounds, without guarding, and without rebound.   Neurologic:   Alert and  oriented x4;  grossly normal neurologically.  Impression/Plan: Nancy Love is here for a colonoscopy to be performed for positive occult blood. No prior colonoscopies.   Risks, benefits, limitations, and alternatives regarding  colonoscopy have been reviewed with the patient.  Questions have been answered.  All parties agreeable.   Virgel Manifold, MD  09/19/2017, 8:58 AM

## 2017-09-19 NOTE — Anesthesia Preprocedure Evaluation (Signed)
Anesthesia Evaluation  Patient identified by MRN, date of birth, ID band Patient awake    Reviewed: Allergy & Precautions, H&P , NPO status , Patient's Chart, lab work & pertinent test results  Airway Mallampati: I  TM Distance: >3 FB Neck ROM: full    Dental no notable dental hx.    Pulmonary former smoker,    Pulmonary exam normal breath sounds clear to auscultation       Cardiovascular hypertension, On Medications Normal cardiovascular exam     Neuro/Psych    GI/Hepatic negative GI ROS, Neg liver ROS,   Endo/Other  negative endocrine ROS  Renal/GU negative Renal ROS     Musculoskeletal   Abdominal   Peds  Hematology negative hematology ROS (+)   Anesthesia Other Findings   Reproductive/Obstetrics negative OB ROS                             Anesthesia Physical Anesthesia Plan  ASA: II  Anesthesia Plan: General   Post-op Pain Management:    Induction:   PONV Risk Score and Plan:   Airway Management Planned:   Additional Equipment:   Intra-op Plan:   Post-operative Plan:   Informed Consent: I have reviewed the patients History and Physical, chart, labs and discussed the procedure including the risks, benefits and alternatives for the proposed anesthesia with the patient or authorized representative who has indicated his/her understanding and acceptance.     Plan Discussed with:   Anesthesia Plan Comments:         Anesthesia Quick Evaluation

## 2017-09-19 NOTE — Transfer of Care (Signed)
Immediate Anesthesia Transfer of Care Note  Patient: Nancy Love  Procedure(s) Performed: COLONOSCOPY WITH PROPOFOL WITH BIOPSIES (N/A Rectum) POLYPECTOMY (N/A Rectum)  Patient Location: PACU  Anesthesia Type: General  Level of Consciousness: awake, alert  and patient cooperative  Airway and Oxygen Therapy: Patient Spontanous Breathing and Patient connected to supplemental oxygen  Post-op Assessment: Post-op Vital signs reviewed, Patient's Cardiovascular Status Stable, Respiratory Function Stable, Patent Airway and No signs of Nausea or vomiting  Post-op Vital Signs: Reviewed and stable  Complications: No apparent anesthesia complications

## 2017-09-20 ENCOUNTER — Encounter: Payer: Self-pay | Admitting: Gastroenterology

## 2017-09-20 ENCOUNTER — Ambulatory Visit: Payer: BLUE CROSS/BLUE SHIELD | Admitting: Gastroenterology

## 2017-09-22 ENCOUNTER — Encounter: Payer: Self-pay | Admitting: Gastroenterology

## 2017-10-18 ENCOUNTER — Ambulatory Visit: Payer: BLUE CROSS/BLUE SHIELD | Admitting: Gastroenterology

## 2017-10-20 ENCOUNTER — Inpatient Hospital Stay: Payer: BLUE CROSS/BLUE SHIELD | Attending: Oncology

## 2017-10-20 DIAGNOSIS — D696 Thrombocytopenia, unspecified: Secondary | ICD-10-CM | POA: Insufficient documentation

## 2017-10-20 DIAGNOSIS — D693 Immune thrombocytopenic purpura: Secondary | ICD-10-CM

## 2017-10-20 LAB — CBC WITH DIFFERENTIAL/PLATELET
ABS IMMATURE GRANULOCYTES: 0.06 10*3/uL (ref 0.00–0.07)
BASOS ABS: 0.1 10*3/uL (ref 0.0–0.1)
BASOS PCT: 1 %
Eosinophils Absolute: 0.5 10*3/uL (ref 0.0–0.5)
Eosinophils Relative: 7 %
HCT: 41.6 % (ref 36.0–46.0)
HEMOGLOBIN: 13.6 g/dL (ref 12.0–15.0)
Immature Granulocytes: 1 %
LYMPHS PCT: 18 %
Lymphs Abs: 1.3 10*3/uL (ref 0.7–4.0)
MCH: 29.9 pg (ref 26.0–34.0)
MCHC: 32.7 g/dL (ref 30.0–36.0)
MCV: 91.4 fL (ref 80.0–100.0)
Monocytes Absolute: 0.4 10*3/uL (ref 0.1–1.0)
Monocytes Relative: 5 %
NEUTROS ABS: 4.8 10*3/uL (ref 1.7–7.7)
Neutrophils Relative %: 68 %
PLATELETS: 56 10*3/uL — AB (ref 150–400)
RBC: 4.55 MIL/uL (ref 3.87–5.11)
RDW: 13 % (ref 11.5–15.5)
WBC: 7 10*3/uL (ref 4.0–10.5)
nRBC: 0 % (ref 0.0–0.2)

## 2017-12-04 ENCOUNTER — Telehealth: Payer: Self-pay | Admitting: *Deleted

## 2017-12-04 NOTE — Telephone Encounter (Signed)
Patient notified of most recent lab results, verbalized understanding of results.

## 2017-12-22 ENCOUNTER — Telehealth: Payer: Self-pay

## 2017-12-22 NOTE — Telephone Encounter (Signed)
-----   Message from Virgel Manifold, MD sent at 09/26/2017  3:01 PM EDT ----- Vester Titsworth can you schedule this patient with Dr. Allen Norris for repeat colonoscopy for polyp surveillance in 3-4 months with 2 day prep. Pt. Had a possible polyp in the ascending colon that could not be visualized well, and I have spoken to Dr. Allen Norris about trying to visualize this area with retroflexion in the cecum/ascending colon.  Please let the patient know that the polyps removed from her colon were benign but precancerous, and her repeat colonoscopy is due to the fair prep on her exam, and to reevaluate her colon for polyps.

## 2017-12-22 NOTE — Telephone Encounter (Signed)
Contacted pt to schedule follow up colonoscopy. Pt would like to wait until Jan or Feb to schedule. Pt requested a call back in Jan.

## 2018-01-20 NOTE — Progress Notes (Deleted)
[facility]  [phone] [phone]  ID: Nancy Love OB: 1950-06-02  MR#: [id number]  JQB#:[id number]  Patient Care Team: Ellamae Sia, MD as PCP - General (Internal Medicine)  CHIEF COMPLAINT: ITP  INTERVAL HISTORY: Patient returns to clinic today for repeat laboratory work and further evaluation.  She continues to feel well and remains asymptomatic.  She denies any easy bleeding or bruising.  She has no neurologic complaints.  She denies any recent fevers or illnesses.  She has a good appetite and denies weight loss. She has no chest pain or shortness of breath. She denies any nausea, vomiting, constipation, or diarrhea. She has no urinary complaints.  Patient feels at her baseline offers no specific complaints today.  REVIEW OF SYSTEMS:   Review of Systems  Constitutional: Negative.  Negative for fever, malaise/fatigue and weight loss.  Respiratory: Negative.  Negative for cough, hemoptysis and shortness of breath.   Cardiovascular: Negative.  Negative for chest pain.  Gastrointestinal: Negative.  Negative for abdominal pain and melena.  Genitourinary: Negative.  Negative for hematuria.  Musculoskeletal: Negative.  Negative for joint pain.  Skin: Negative.  Negative for rash.  Neurological: Negative.  Negative for sensory change, focal weakness, weakness and headaches.  Endo/Heme/Allergies: Does not bruise/bleed easily.  Psychiatric/Behavioral: Negative.  The patient does not have insomnia.     As per HPI. Otherwise, a complete review of systems is negative.  PAST MEDICAL HISTORY: Past Medical History:  Diagnosis Date  . Allergy   . Depression   . Hypertension    no longer needs meds  . Thrombocytopenia ([place])     PAST SURGICAL HISTORY: Past Surgical History:  Procedure Laterality Date  . CARPAL TUNNEL RELEASE    . COLONOSCOPY WITH PROPOFOL N/A 09/19/2017   Procedure: COLONOSCOPY WITH PROPOFOL WITH BIOPSIES;  Surgeon:  Virgel Manifold, MD;  Location: [facility];  Service: Endoscopy;  Laterality: N/A;  Needs later appt  . POLYPECTOMY N/A 09/19/2017   Procedure: POLYPECTOMY;  Surgeon: Virgel Manifold, MD;  Location: [facility];  Service: Endoscopy;  Laterality: N/A;  . TONSILLECTOMY Bilateral 1968    FAMILY HISTORY Family History  Problem Relation Age of Onset  . Breast cancer Mother        65's  . Breast cancer Daughter        28's   . Clotting disorder Daughter        ADVANCED DIRECTIVES:    HEALTH MAINTENANCE: Social History   Tobacco Use  . Smoking status: Former Smoker    Last attempt to quit: 04/19/2014    Years since quitting: 3.7  . Smokeless tobacco: Never Used  Substance Use Topics  . Alcohol use: Yes    Comment: Holidays  . Drug use: No     Colonoscopy:  PAP:  Bone density:  Lipid panel:  No Known Allergies  Current Outpatient Medications  Medication Sig Dispense Refill  . albuterol (PROAIR HFA) 108 (90 Base) MCG/ACT inhaler Inhale 1 puff into the lungs every 4 (four) hours as needed.    . ARIPiprazole (ABILIFY) 5 MG tablet 5 mg daily.   1  . cetirizine (ZYRTEC) 10 MG tablet Take 10 mg by mouth daily.    Marland Kitchen FLUoxetine (PROZAC) 40 MG capsule Take 80 mg by mouth daily.   2  . gabapentin (NEURONTIN) 400 MG capsule Take 400 mg by mouth 2 (two) times daily.   2  . nitroGLYCERIN (NITROSTAT) 0.4 MG SL tablet Place 1  tablet under the tongue every 5 (five) minutes as needed.    . polyethylene glycol (GOLYTELY) 236 g solution Drink one 8 oz glass every 20 mins until entire container is finished (Patient not taking: Reported on 05/26/2017) 4000 mL 0  . traZODone (DESYREL) 150 MG tablet   0   No current facility-administered medications for this visit.     OBJECTIVE: There were no vitals filed for this visit.   There is no height or weight on file to calculate BMI.    ECOG FS:0 - Asymptomatic  General: Well-developed, well-nourished, no acute  distress. Eyes: Pink conjunctiva, anicteric sclera. HEENT: Normocephalic, moist mucous membranes, clear oropharnyx. Lungs: Clear to auscultation bilaterally. Heart: Regular rate and rhythm. No rubs, murmurs, or gallops. Abdomen: Soft, nontender, nondistended. No organomegaly noted, normoactive bowel sounds. Musculoskeletal: No edema, cyanosis, or clubbing. Neuro: Alert, answering all questions appropriately. Cranial nerves grossly intact. Skin: No rashes or petechiae noted. Psych: Normal affect.  LAB RESULTS:  No results found for: NA, K, CL, CO2, GLUCOSE, BUN, CREATININE, CALCIUM, PROT, ALBUMIN, AST, ALT, ALKPHOS, BILITOT, GFRNONAA, GFRAA  Lab Results  Component Value Date   WBC 7.0 10/20/2017   NEUTROABS 4.8 10/20/2017   HGB 13.6 10/20/2017   HCT 41.6 10/20/2017   MCV 91.4 10/20/2017   PLT 56 (L) 10/20/2017     STUDIES: No results found.  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  PLAN:    1. 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  Patient's platelet count remains decreased at 53, which is essentially her baseline.  Previously, all of her other laboratory work was either negative or within normal limits.  Patient was unresponsive to prednisone and Rituxan. Nplate injections have been denied by insurance unless her platelet count falls below 20 or she has evidence of bleeding.  No intervention is needed at this time.  Return to clinic in 3 months for laboratory work and then in 6 months for laboratory work and further evaluation. 2. Family history breast cancer: By report, patient's mother had breast cancer in her 79s and her daughter had breast cancer in her 15s. Will consider genetic testing in the future.   3.  Heme positive stools: Patient has colonoscopy scheduled in the next 2 to 3 weeks.  I spent a total of 20 minutes face-to-face with the patient of which greater than 50% of the visit was spent in counseling and coordination of  care as detailed above.   Patient expressed understanding and was in agreement with this plan. She also understands that She can call clinic at any time with any questions, concerns, or complaints.    Lloyd Huger, MD   01/20/2018 8:29 AM

## 2018-01-26 ENCOUNTER — Other Ambulatory Visit: Payer: BLUE CROSS/BLUE SHIELD

## 2018-01-26 ENCOUNTER — Ambulatory Visit: Payer: BLUE CROSS/BLUE SHIELD | Admitting: Oncology

## 2018-02-16 NOTE — Progress Notes (Signed)
[name]  [phone]) [phone] [phone]) [phone]  ID: Nancy Love OB: July 04, 1950  MR#: [id number]  JOI#:[id number]  Patient Care Team: Ellamae Sia, MD as PCP - General (Internal Medicine)  CHIEF COMPLAINT: ITP  INTERVAL HISTORY: Patient returns to clinic today for repeat laboratory work and further evaluation.  She continues to feel well and remains asymptomatic.  She denies any easy bleeding or bruising.  She has no neurologic complaints.  She denies any recent fevers or illnesses.  She has a good appetite and denies weight loss. She has no chest pain or shortness of breath. She denies any nausea, vomiting, constipation, or diarrhea. She has no urinary complaints.  Patient feels at her baseline offers no specific complaints today.  REVIEW OF SYSTEMS:   Review of Systems  Constitutional: Negative.  Negative for fever, malaise/fatigue and weight loss.  Respiratory: Negative.  Negative for cough, hemoptysis and shortness of breath.   Cardiovascular: Negative.  Negative for chest pain.  Gastrointestinal: Negative.  Negative for abdominal pain and melena.  Genitourinary: Negative.  Negative for hematuria.  Musculoskeletal: Positive for joint pain.  Skin: Negative.  Negative for rash.  Neurological: Negative.  Negative for sensory change, focal weakness, weakness and headaches.  Endo/Heme/Allergies: Does not bruise/bleed easily.  Psychiatric/Behavioral: Negative.  The patient does not have insomnia.     As per HPI. Otherwise, a complete review of systems is negative.  PAST MEDICAL HISTORY: Past Medical History:  Diagnosis Date  . Allergy   . Depression   . Hypertension    no longer needs meds  . Thrombocytopenia ([place])     PAST SURGICAL HISTORY: Past Surgical History:  Procedure Laterality Date  . CARPAL TUNNEL RELEASE    . COLONOSCOPY WITH PROPOFOL N/A 09/19/2017   Procedure: COLONOSCOPY WITH PROPOFOL WITH BIOPSIES;  Surgeon: Virgel Manifold, MD;  Location: [facility];  Service: Endoscopy;  Laterality: N/A;  Needs later appt  . POLYPECTOMY N/A 09/19/2017   Procedure: POLYPECTOMY;  Surgeon: Virgel Manifold, MD;  Location: [facility];  Service: Endoscopy;  Laterality: N/A;  . TONSILLECTOMY Bilateral 1968    FAMILY HISTORY Family History  Problem Relation Age of Onset  . Breast cancer Mother        81's  . Breast cancer Daughter        70's   . Clotting disorder Daughter        ADVANCED DIRECTIVES:    HEALTH MAINTENANCE: Social History   Tobacco Use  . Smoking status: Former Smoker    Last attempt to quit: 04/19/2014    Years since quitting: 3.8  . Smokeless tobacco: Never Used  Substance Use Topics  . Alcohol use: Yes    Comment: Holidays  . Drug use: No     Colonoscopy:  PAP:  Bone density:  Lipid panel:  No Known Allergies  Current Outpatient Medications  Medication Sig Dispense Refill  . albuterol (PROAIR HFA) 108 (90 Base) MCG/ACT inhaler Inhale 1 puff into the lungs every 4 (four) hours as needed.    . ARIPiprazole (ABILIFY) 5 MG tablet 5 mg daily.   1  . cetirizine (ZYRTEC) 10 MG tablet Take 10 mg by mouth daily.    Marland Kitchen FLUoxetine (PROZAC) 40 MG capsule Take 80 mg by mouth daily.   2  . gabapentin (NEURONTIN) 400 MG capsule Take 400 mg by mouth 2 (two) times daily.   2  . nitroGLYCERIN (NITROSTAT) 0.4 MG SL tablet Place 1 tablet under  the tongue every 5 (five) minutes as needed.    . traZODone (DESYREL) 150 MG tablet   0   No current facility-administered medications for this visit.     OBJECTIVE: Vitals:   02/23/18 1320  BP: 124/87  Pulse: 80  Temp: 97.8 F (36.6 C)     Body mass index is 20.62 kg/m.    ECOG FS:0 - Asymptomatic  General: Well-developed, well-nourished, no acute distress. Eyes: Pink conjunctiva, anicteric sclera. HEENT: Normocephalic, moist mucous membranes, clear oropharnyx. Lungs: Clear to auscultation bilaterally. Heart: Regular rate  and rhythm. No rubs, murmurs, or gallops. Abdomen: Soft, nontender, nondistended. No organomegaly noted, normoactive bowel sounds. Musculoskeletal: No edema, cyanosis, or clubbing. Neuro: Alert, answering all questions appropriately. Cranial nerves grossly intact. Skin: No rashes or petechiae noted. Psych: Normal affect. Lymphatics: No cervical, calvicular, axillary or inguinal LAD.  LAB RESULTS:  No results found for: NA, K, CL, CO2, GLUCOSE, BUN, CREATININE, CALCIUM, PROT, ALBUMIN, AST, ALT, ALKPHOS, BILITOT, GFRNONAA, GFRAA  Lab Results  Component Value Date   WBC 7.0 10/20/2017   NEUTROABS 4.8 10/20/2017   HGB 13.6 10/20/2017   HCT 41.6 10/20/2017   MCV 91.4 10/20/2017   PLT 56 (L) 10/20/2017     STUDIES: No results found.  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  PLAN:    1. 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  Patient platelet count remains decreased, but is slowly trending up and is now 61.  Previously, all of her other laboratory work was either negative or within normal limits.  Patient was unresponsive to prednisone and Rituxan. Nplate injections have been denied by insurance unless her platelet count falls below 20 or she has evidence of bleeding.  No intervention is needed at this time.  Return to clinic in 3 months for laboratory work only and then in 6 months for laboratory work and further evaluation. 2. Family history breast cancer: By report, patient's mother had breast cancer in her 66s and her daughter had breast cancer in her 55s. Will consider genetic testing in the future.   3.  Heme positive stools: Resolved.  Colonoscopy on September 19, 2017 noted 4 polyps 3 of which were removed and benign.  I spent a total of 20 minutes face-to-face with the patient of which greater than 50% of the visit was spent in counseling and coordination of care as detailed above.   Patient expressed understanding and was in agreement  with this plan. She also understands that She can call clinic at any time with any questions, concerns, or complaints.    Lloyd Huger, MD   02/23/2018 1:28 PM

## 2018-02-23 ENCOUNTER — Other Ambulatory Visit: Payer: Self-pay

## 2018-02-23 ENCOUNTER — Inpatient Hospital Stay: Payer: Medicare HMO | Attending: Oncology

## 2018-02-23 ENCOUNTER — Inpatient Hospital Stay (HOSPITAL_BASED_OUTPATIENT_CLINIC_OR_DEPARTMENT_OTHER): Payer: Medicare HMO | Admitting: Oncology

## 2018-02-23 VITALS — BP 124/87 | HR 80 | Temp 97.8°F | Ht 62.0 in | Wt 112.8 lb

## 2018-02-23 DIAGNOSIS — Z87891 Personal history of nicotine dependence: Secondary | ICD-10-CM | POA: Diagnosis not present

## 2018-02-23 DIAGNOSIS — D693 Immune thrombocytopenic purpura: Secondary | ICD-10-CM

## 2018-02-23 DIAGNOSIS — Z803 Family history of malignant neoplasm of breast: Secondary | ICD-10-CM | POA: Insufficient documentation

## 2018-02-23 DIAGNOSIS — F329 Major depressive disorder, single episode, unspecified: Secondary | ICD-10-CM | POA: Insufficient documentation

## 2018-02-23 DIAGNOSIS — Z79899 Other long term (current) drug therapy: Secondary | ICD-10-CM | POA: Diagnosis not present

## 2018-02-23 DIAGNOSIS — I10 Essential (primary) hypertension: Secondary | ICD-10-CM

## 2018-02-23 LAB — CBC WITH DIFFERENTIAL/PLATELET
ABS IMMATURE GRANULOCYTES: 0.05 10*3/uL (ref 0.00–0.07)
BASOS PCT: 1 %
Basophils Absolute: 0.1 10*3/uL (ref 0.0–0.1)
EOS ABS: 0.5 10*3/uL (ref 0.0–0.5)
Eosinophils Relative: 4 %
HEMATOCRIT: 44.9 % (ref 36.0–46.0)
Hemoglobin: 14.6 g/dL (ref 12.0–15.0)
Immature Granulocytes: 0 %
LYMPHS ABS: 1.8 10*3/uL (ref 0.7–4.0)
LYMPHS PCT: 16 %
MCH: 29.5 pg (ref 26.0–34.0)
MCHC: 32.5 g/dL (ref 30.0–36.0)
MCV: 90.7 fL (ref 80.0–100.0)
MONO ABS: 0.7 10*3/uL (ref 0.1–1.0)
Monocytes Relative: 6 %
NEUTROS ABS: 8.2 10*3/uL — AB (ref 1.7–7.7)
NEUTROS PCT: 73 %
PLATELETS: 61 10*3/uL — AB (ref 150–400)
RBC: 4.95 MIL/uL (ref 3.87–5.11)
RDW: 12.6 % (ref 11.5–15.5)
WBC: 11.3 10*3/uL — ABNORMAL HIGH (ref 4.0–10.5)
nRBC: 0 % (ref 0.0–0.2)

## 2018-02-23 NOTE — Progress Notes (Signed)
Patient is here today to follow up on her Idiopathic thrombocytopenic purpura. Patient stated that she intermittently has joint pain due to arthritis. Patient stated that her appetite is good. Patient denied fever, chills, nausea, vomiting and poor appetite.

## 2018-03-08 ENCOUNTER — Telehealth: Payer: Self-pay | Admitting: *Deleted

## 2018-03-08 DIAGNOSIS — Z87891 Personal history of nicotine dependence: Secondary | ICD-10-CM

## 2018-03-08 DIAGNOSIS — Z122 Encounter for screening for malignant neoplasm of respiratory organs: Secondary | ICD-10-CM

## 2018-03-08 NOTE — Telephone Encounter (Signed)
Received referral for initial lung cancer screening scan. Contacted patient and obtained smoking history,(former smoker, quit 04/19/14, 43 pack year) as well as answering questions related to screening process. Patient denies signs of lung cancer such as weight loss or hemoptysis. Patient denies comorbidity that would prevent curative treatment if lung cancer were found. Patient is scheduled for shared decision making visit and CT scan on 03/23/18 at 11am.

## 2018-03-19 ENCOUNTER — Encounter: Payer: Self-pay | Admitting: Internal Medicine

## 2018-03-23 ENCOUNTER — Ambulatory Visit
Admission: RE | Admit: 2018-03-23 | Discharge: 2018-03-23 | Disposition: A | Discharge status: Home / Self Care | Payer: Medicare Other | Source: Ambulatory Visit | Attending: Nurse Practitioner | Admitting: Nurse Practitioner

## 2018-03-23 ENCOUNTER — Inpatient Hospital Stay: Payer: Medicare Other | Attending: Nurse Practitioner | Admitting: Nurse Practitioner

## 2018-03-23 ENCOUNTER — Encounter: Payer: Self-pay | Admitting: Nurse Practitioner

## 2018-03-23 ENCOUNTER — Other Ambulatory Visit: Payer: Self-pay

## 2018-03-23 DIAGNOSIS — Z87891 Personal history of nicotine dependence: Secondary | ICD-10-CM | POA: Insufficient documentation

## 2018-03-23 DIAGNOSIS — Z122 Encounter for screening for malignant neoplasm of respiratory organs: Secondary | ICD-10-CM | POA: Diagnosis not present

## 2018-03-23 NOTE — Progress Notes (Signed)
In accordance with CMS guidelines, patient has met eligibility criteria including age, absence of signs or symptoms of lung cancer.  Social History   Tobacco Use  . Smoking status: Former Smoker    Packs/day: 1.00    Years: 43.00    Pack years: 43.00    Types: Cigarettes    Last attempt to quit: 04/19/2014    Years since quitting: 3.9  . Smokeless tobacco: Never Used  Substance Use Topics  . Alcohol use: Yes    Comment: Holidays  . Drug use: No      A shared decision-making session was conducted prior to the performance of CT scan. This includes one or more decision aids, includes benefits and harms of screening, follow-up diagnostic testing, over-diagnosis, false positive rate, and total radiation exposure.   Counseling on the importance of adherence to annual lung cancer LDCT screening, impact of co-morbidities, and ability or willingness to undergo diagnosis and treatment is imperative for compliance of the program.   Counseling on the importance of continued smoking cessation for former smokers; the importance of smoking cessation for current smokers, and information about tobacco cessation interventions have been given to patient including [place] and [facility] quit [place] programs.   Written order for lung cancer screening with LDCT has been given to the patient and any and all questions have been answered to the best of my abilities.    Yearly follow up will be coordinated by Burgess Estelle, Thoracic Navigator.  Beckey Rutter, DNP, AGNP-C [place] [facility] [phone] (work cell) [phone] (office) 03/23/18 1:31 PM

## 2018-03-26 ENCOUNTER — Telehealth: Payer: Self-pay | Admitting: *Deleted

## 2018-03-26 NOTE — Telephone Encounter (Signed)
Notified patient of LDCT lung cancer screening program results with recommendation for 12 month follow up imaging. Also notified of incidental findings noted below and is encouraged to discuss further with PCP who will receive a copy of this note and/or the CT report. Patient verbalizes understanding.   IMPRESSION: 1. Lung-RADS 1, negative. Continue annual screening with low-dose chest CT without contrast in 12 months. 2. Aortic Atherosclerosis (ICD10-I70.0) and Emphysema

## 2018-03-28 ENCOUNTER — Encounter: Payer: Self-pay | Admitting: *Deleted

## 2018-04-23 ENCOUNTER — Encounter: Payer: Self-pay | Admitting: *Deleted

## 2018-04-26 IMAGING — DX DG ANKLE COMPLETE 3+V*R*
3 series · 4 of 4 positions shown · non-contrast
Comparison: None.

CLINICAL DATA: Right ankle pain after injury

EXAM:
RIGHT ANKLE - COMPLETE 3+ VIEW

[ankle ap]
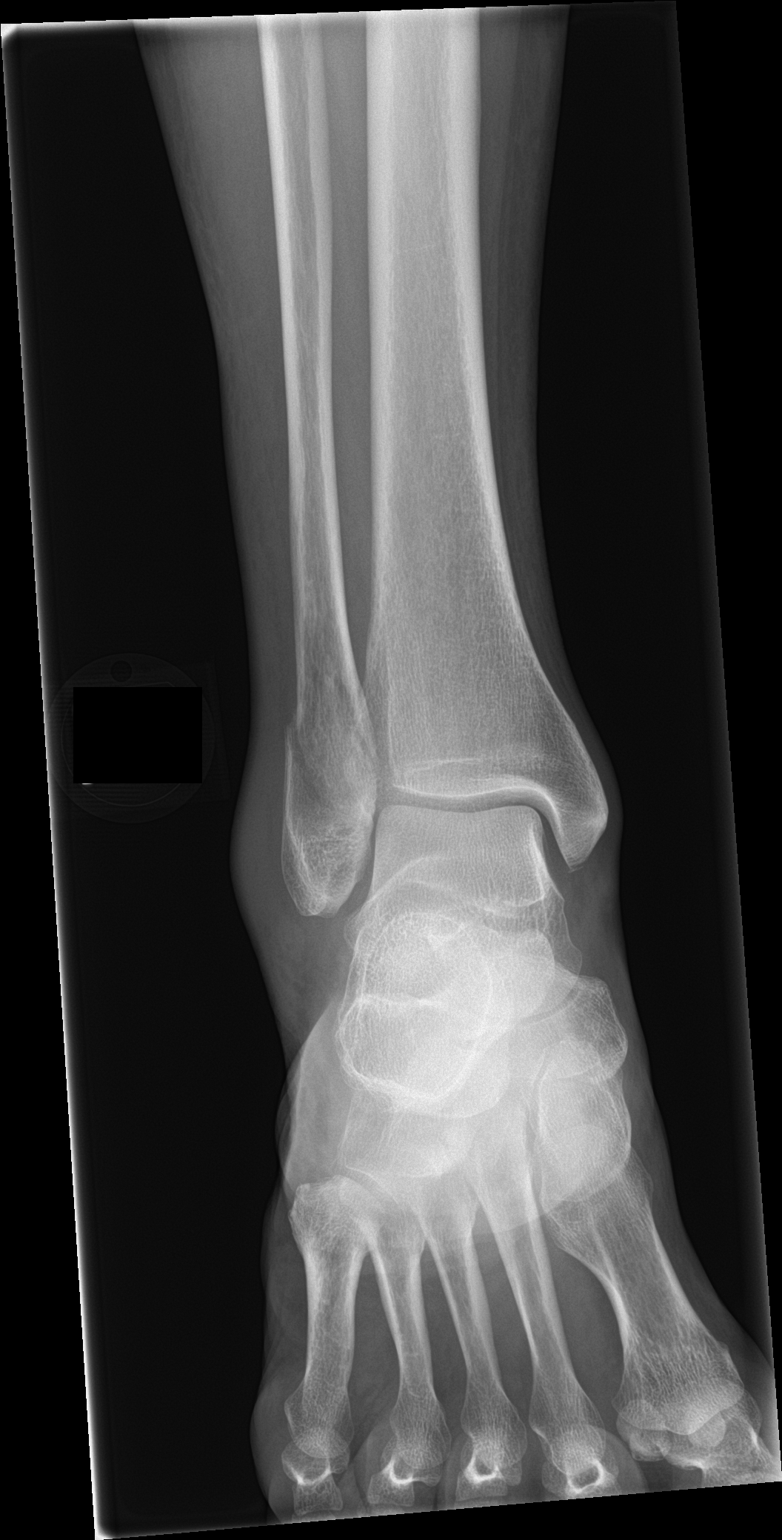

[ankle obl]
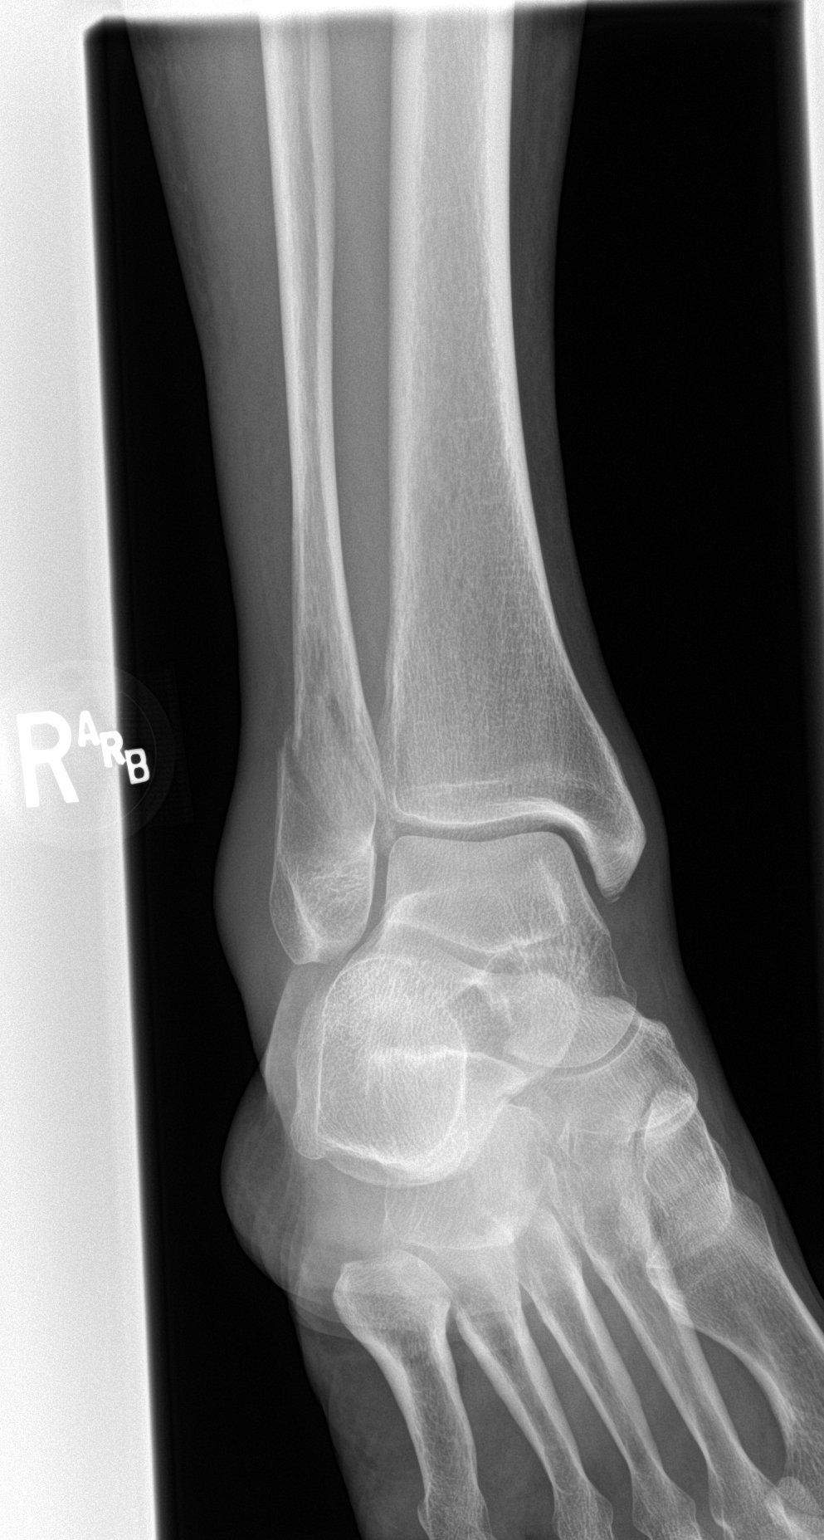

[Series 3: ankle lat · 0.14mm/px · 2 of 2 slices shown]
[im 1/2]
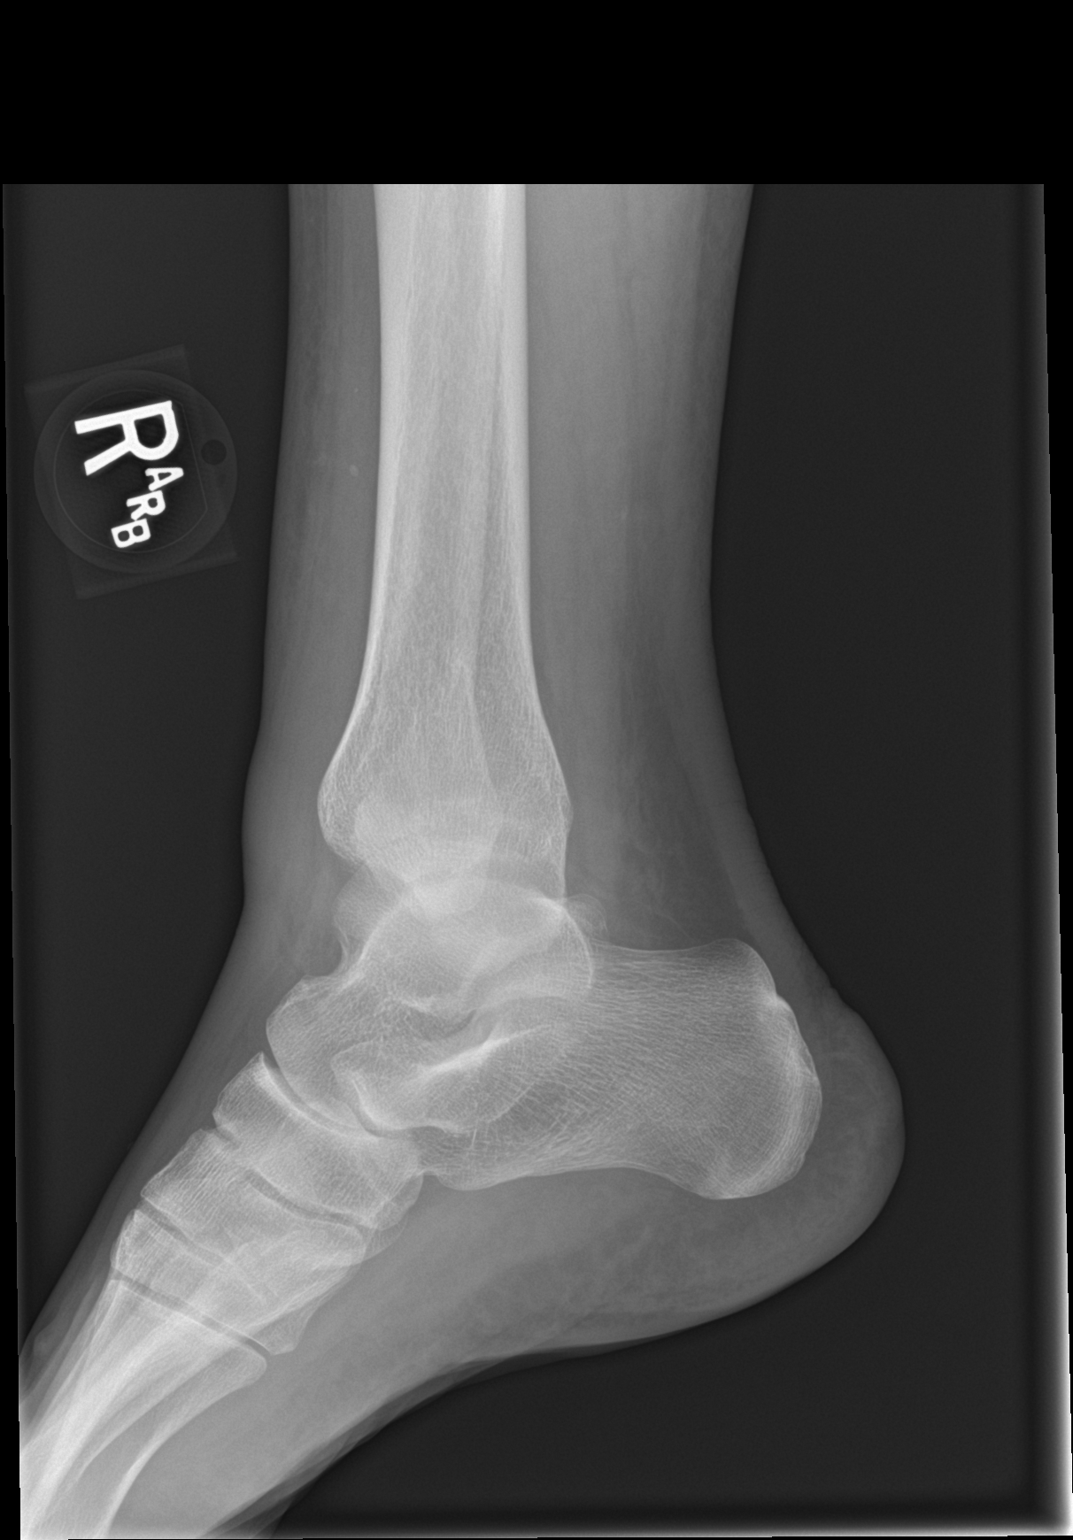
[im 2/2]
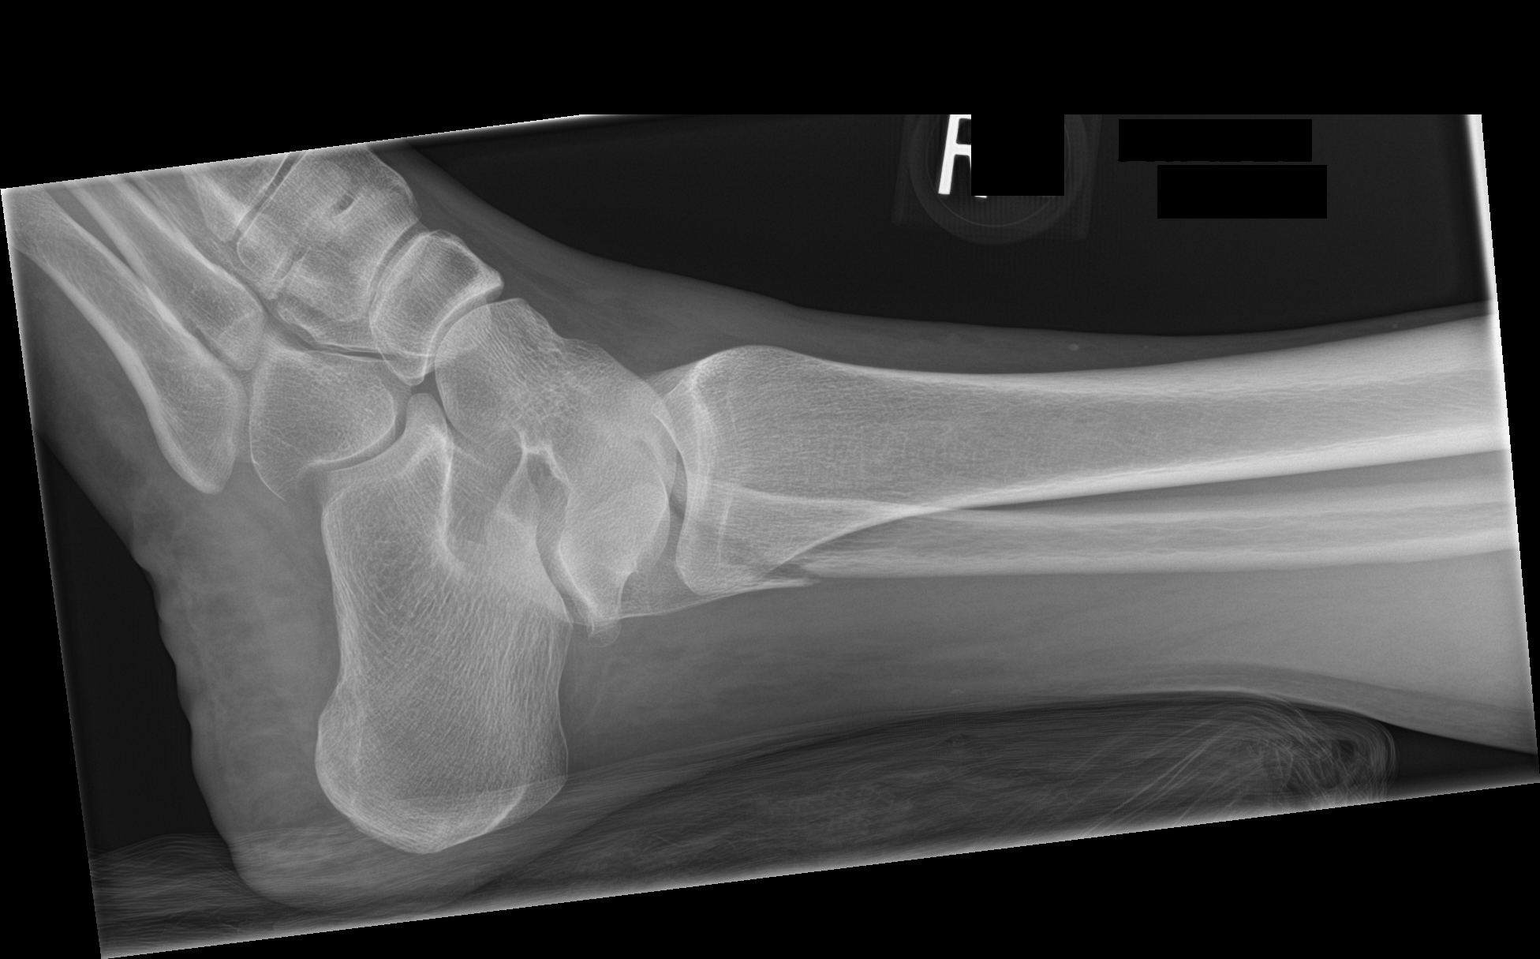

[4 of 4 positions shown; findings below may reference images not displayed]

FINDINGS: Lateral right ankle soft tissue swelling. Oblique intra-articular
right lateral malleolus fracture with 3 mm lateral displacement of
the distal fracture fragment. Tiny nondisplaced avulsion fracture
fragment at the tip of the medial malleolus. No additional fracture.
No subluxation. No suspicious focal osseous lesion. No appreciable
degenerative or erosive arthropathy. No radiopaque foreign body.
IMPRESSION: 1. Mildly displaced right lateral malleolus fracture.
2. Nondisplaced tiny avulsion fracture at the tip of the medial
malleolus.
3. No subluxation.

## 2018-05-25 ENCOUNTER — Institutional Professional Consult (permissible substitution): Payer: Medicare Other | Admitting: Pulmonary Disease

## 2018-05-31 ENCOUNTER — Other Ambulatory Visit: Payer: Self-pay

## 2018-05-31 ENCOUNTER — Ambulatory Visit: Payer: Medicare Other | Admitting: Adult Health

## 2018-05-31 ENCOUNTER — Encounter: Payer: Self-pay | Admitting: Adult Health

## 2018-05-31 VITALS — BP 115/71 | HR 87 | Resp 16 | Ht 62.0 in | Wt 112.2 lb

## 2018-05-31 DIAGNOSIS — J449 Chronic obstructive pulmonary disease, unspecified: Secondary | ICD-10-CM

## 2018-05-31 DIAGNOSIS — Z87891 Personal history of nicotine dependence: Secondary | ICD-10-CM

## 2018-05-31 DIAGNOSIS — R0602 Shortness of breath: Secondary | ICD-10-CM

## 2018-05-31 NOTE — Progress Notes (Signed)
[facility] [place], [place] [address]  Pulmonary Sleep Medicine   Office Visit Note  Patient Name: Nancy Love DOB: Nov 22, 1950 MRN [id number]  Date of Service: 05/31/2018  Complaints/HPI: Pt is here to establish care.   Needs two spirometry readings for interlock system on car. Quit smoking 1.5 years ago (1ppd)   ROS  General: (-) fever, (-) chills, (-) night sweats, (-) weakness Skin: (-) rashes, (-) itching,. Eyes: (-) visual changes, (-) redness, (-) itching. Nose and Sinuses: (-) nasal stuffiness or itchiness, (-) postnasal drip, (-) nosebleeds, (-) sinus trouble. Mouth and Throat: (-) sore throat, (-) hoarseness. Neck: (-) swollen glands, (-) enlarged thyroid, (-) neck pain. Respiratory: - cough, (-) bloody sputum, + shortness of breath, - wheezing. Cardiovascular: - ankle swelling, (-) chest pain. Lymphatic: (-) lymph node enlargement. Neurologic: (-) numbness, (-) tingling. Psychiatric: (-) anxiety, (-) depression   Current Medication: Outpatient Encounter Medications as of 05/31/2018  Medication Sig Note  . albuterol (PROAIR HFA) 108 (90 Base) MCG/ACT inhaler Inhale 1 puff into the lungs every 4 (four) hours as needed. 09/19/2017: A year ago   . ARIPiprazole (ABILIFY) 5 MG tablet 5 mg daily.    . cetirizine (ZYRTEC) 10 MG tablet Take 10 mg by mouth daily.   Marland Kitchen FLUoxetine (PROZAC) 40 MG capsule Take 80 mg by mouth daily.    Marland Kitchen gabapentin (NEURONTIN) 400 MG capsule Take 400 mg by mouth 2 (two) times daily.    . nitroGLYCERIN (NITROSTAT) 0.4 MG SL tablet Place 1 tablet under the tongue every 5 (five) minutes as needed. 09/19/2017: Never used'   . traZODone (DESYREL) 50 MG tablet Take 50 mg by mouth at bedtime. Take 3 tablets   . traZODone (DESYREL) 150 MG tablet  11/27/2015: Received from: External Pharmacy   No facility-administered encounter medications on file as of 05/31/2018.     Surgical History: Past Surgical History:  Procedure  Laterality Date  . CARPAL TUNNEL RELEASE    . COLONOSCOPY WITH PROPOFOL N/A 09/19/2017   Procedure: COLONOSCOPY WITH PROPOFOL WITH BIOPSIES;  Surgeon: Virgel Manifold, MD;  Location: [employer] [facility];  Service: Endoscopy;  Laterality: N/A;  Needs later appt  . POLYPECTOMY N/A 09/19/2017   Procedure: POLYPECTOMY;  Surgeon: Virgel Manifold, MD;  Location: [facility];  Service: Endoscopy;  Laterality: N/A;  . TONSILLECTOMY Bilateral 1968    Medical History: Past Medical History:  Diagnosis Date  . Allergy   . Depression   . Hypertension    no longer needs meds  . Thrombocytopenia ([place])     Family History: Family History  Problem Relation Age of Onset  . Breast cancer Mother        10's  . Breast cancer Daughter        41's   . Clotting disorder Daughter     Social History: Social History   Socioeconomic History  . Marital status: Widowed    Spouse name: Not on file  . Number of children: Not on file  . Years of education: Not on file  . Highest education level: Not on file  Occupational History  . Not on file  Social Needs  . Financial resource strain: Not on file  . Food insecurity:    Worry: Not on file    Inability: Not on file  . Transportation needs:    Medical: Not on file    Non-medical: Not on file  Tobacco Use  . Smoking status: Former Smoker  Packs/day: 1.00    Years: 43.00    Pack years: 43.00    Types: Cigarettes    Last attempt to quit: 04/19/2014    Years since quitting: 4.1  . Smokeless tobacco: Never Used  Substance and Sexual Activity  . Alcohol use: Yes    Comment: Holidays/ socially  . Drug use: No  . Sexual activity: Not on file  Lifestyle  . Physical activity:    Days per week: Not on file    Minutes per session: Not on file  . Stress: Not on file  Relationships  . Social connections:    Talks on phone: Not on file    Gets together: Not on file    Attends religious service: Not on file    Active member of  club or organization: Not on file    Attends meetings of clubs or organizations: Not on file    Relationship status: Not on file  . Intimate partner violence:    Fear of current or ex partner: Not on file    Emotionally abused: Not on file    Physically abused: Not on file    Forced sexual activity: Not on file  Other Topics Concern  . Not on file  Social History Narrative  . Not on file    Vital Signs: Blood pressure 115/71, pulse 87, resp. rate 16, height 5\' 2"  (1.575 m), weight 112 lb 3.2 oz (50.9 kg), SpO2 92 %.  Examination: General Appearance: The patient is well-developed, well-nourished, and in no distress. Skin: Gross inspection of skin unremarkable. Head: normocephalic, no gross deformities. Eyes: no gross deformities noted. ENT: ears appear grossly normal no exudates. Neck: Supple. No thyromegaly. No LAD. Respiratory: slight expiratory wheeze bilaterally. Cardiovascular: Normal S1 and S2 without murmur or rub. Extremities: No cyanosis. pulses are equal. Neurologic: Alert and oriented. No involuntary movements.  LABS: No results found for this or any previous visit (from the past 2160 hour(s)).  Radiology: Ct Chest Lung Cancer Screening Low Dose Wo Contrast  Result Date: 03/23/2018 CLINICAL DATA:  Initial lung cancer screening. Forty-three pack-year history. Currently asymptomatic. Former smoker. EXAM: CT CHEST WITHOUT CONTRAST LOW-DOSE FOR LUNG CANCER SCREENING TECHNIQUE: Multidetector CT imaging of the chest was performed following the standard protocol without IV contrast. COMPARISON:  None FINDINGS: Cardiovascular: Heart size is normal. No pericardial effusion. Aortic atherosclerosis. Calcification in the LAD coronary artery noted. Mediastinum/Nodes: No enlarged mediastinal, hilar, or axillary lymph nodes. Thyroid gland, trachea, and esophagus demonstrate no significant findings. Lungs/Pleura: Emphysema. Scar or atelectasis with mild volume loss noted in the lingula.  No pulmonary nodules identified. Upper Abdomen: No acute abnormality. Musculoskeletal: Scoliosis deformity identified mild spondylosis. No aggressive lytic or sclerotic bone lesion. IMPRESSION: 1. Lung-RADS 1, negative. Continue annual screening with low-dose chest CT without contrast in 12 months. 2. Aortic Atherosclerosis (ICD10-I70.0) and Emphysema (ICD10-J43.9). Electronically Signed   By: Kerby Moors M.D.   On: 03/23/2018 14:00    No results found.  No results found.    Assessment and Plan: Patient Active Problem List   Diagnosis Date Noted  . Personal history of tobacco use, presenting hazards to health 03/23/2018  . Abnormal feces   . Benign neoplasm of ascending colon   . Hemorrhoids without complication   . Benign neoplasm of rectosigmoid junction   . Idiopathic thrombocytopenic purpura ([place]) 07/18/2015   1. Chronic obstructive pulmonary disease, unspecified COPD type ([place]) Patient is currently using Brio and pro-air to control her COPD.  She reports moderate  control of her COPD at this time.  However, based on her smoking history as well as her FEV1 on spirometry today she will likely have issues breathing in the interlock system that the [facility] will be placing on her car.  She will come back for second spirometry next week per her request.  2. Personal history of tobacco use, presenting hazards to health Smoking cessation counseling: 1. Pt acknowledges the risks of long term smoking, she will try to quite smoking. 2. Options for different medications including nicotine products, chewing gum, patch etc, Wellbutrin and Chantix is discussed 3. Goal and date of compete cessation is discussed 4. Total time spent in smoking cessation is 15 min.   3. SOB (shortness of breath) FVC is 1.2 which is 41% of the predicted value FEV1 is 0.6 which 29% of the predicted value FEV1/FVC is 53% which is 69% of the pre-predicted value on today's spirometry. - Spirometry with  Graph   General Counseling: I have discussed the findings of the evaluation and examination with Shanina.  I have also discussed any further diagnostic evaluation thatmay be needed or ordered today. Vinisha verbalizes understanding of the findings of todays visit. We also reviewed her medications today and discussed drug interactions and side effects including but not limited excessive drowsiness and altered mental states. We also discussed that there is always a risk not just to her but also people around her. she has been encouraged to call the office with any questions or concerns that should arise related to todays visit.    Time spent: 25 This patient was seen by Orson Gear AGNP-C in Collaboration with Dr. Devona Konig as a part of collaborative care agreement.   I have personally obtained a history, examined the patient, evaluated laboratory and imaging results, formulated the assessment and plan and placed orders.    Allyne Gee, MD [facility] Pulmonary and Critical Care Sleep medicine

## 2018-06-05 ENCOUNTER — Ambulatory Visit: Payer: Medicare Other | Admitting: Internal Medicine

## 2018-06-06 ENCOUNTER — Encounter: Payer: Self-pay | Admitting: Adult Health

## 2018-06-13 ENCOUNTER — Telehealth: Payer: Self-pay

## 2018-06-13 NOTE — Telephone Encounter (Signed)
-----   Message from Virgel Manifold, MD sent at 06/11/2018 11:43 AM EDT ----- Nancy Love,  This pt needed to be scheduled for a colonoscopy with Dr. Allen Norris to see if he can visualize and remove an ascending colon polyp. Looks like you called her in December and she did not schedule then. Can you call her and try to get her scheduled with him. Thank you!

## 2018-06-13 NOTE — Telephone Encounter (Signed)
Contacted pt and she stated she had some pulmonary issues that she has scheduled appts for. Pt will call back to schedule when she is available.

## 2018-06-14 ENCOUNTER — Encounter: Payer: Self-pay | Admitting: Internal Medicine

## 2018-06-14 ENCOUNTER — Ambulatory Visit: Payer: Medicare Other | Admitting: Internal Medicine

## 2018-06-14 ENCOUNTER — Other Ambulatory Visit: Payer: Self-pay

## 2018-06-14 VITALS — BP 112/82 | HR 75 | Resp 16 | Ht 62.0 in | Wt 112.0 lb

## 2018-06-14 DIAGNOSIS — Z87891 Personal history of nicotine dependence: Secondary | ICD-10-CM | POA: Diagnosis not present

## 2018-06-14 DIAGNOSIS — R0602 Shortness of breath: Secondary | ICD-10-CM

## 2018-06-14 DIAGNOSIS — J449 Chronic obstructive pulmonary disease, unspecified: Secondary | ICD-10-CM | POA: Diagnosis not present

## 2018-06-14 MED ORDER — FLUTICASONE FUROATE-VILANTEROL 200-25 MCG/INH IN AEPB: 1.0000 | INHALATION_SPRAY | Freq: Every day | RESPIRATORY_TRACT | 5 refills | Status: AC

## 2018-06-14 NOTE — Progress Notes (Signed)
[facility] [place], [place] [address]  Pulmonary Sleep Medicine   Office Visit Note  Patient Name: Nancy Love DOB: 01-04-1951 MRN [id number]  Date of Service: 06/14/2018  Complaints/HPI: Patient has a history of DWI over 15 years ago. She has not been driving. Patient states that in order to get her license back she needs to be evalauted. She states that she smokes quit about a 1.5 years ago. She smoked about 2PPD for over 40 years but did taper down towards the end. She states that she does experience SOB when she exerts herself. She states that she has been previously told she has COPD. Patient states that she is also supposed to see Dr Raul Del for a second opinion. She cannot have her ingnigiton interlock device removed until the pulmonary function is evaluated. She states she is not on oxygen has never been evaluated. She states that she still drinks socially.  ROS  General: (-) fever, (-) chills, (-) night sweats, (-) weakness Skin: (-) rashes, (-) itching,. Eyes: (-) visual changes, (-) redness, (-) itching. Nose and Sinuses: (-) nasal stuffiness or itchiness, (-) postnasal drip, (-) nosebleeds, (-) sinus trouble. Mouth and Throat: (-) sore throat, (-) hoarseness. Neck: (-) swollen glands, (-) enlarged thyroid, (-) neck pain. Respiratory: - cough, (-) bloody sputum, + shortness of breath, + wheezing. Cardiovascular: - ankle swelling, (-) chest pain. Lymphatic: (-) lymph node enlargement. Neurologic: (-) numbness, (-) tingling. Psychiatric: (-) anxiety, (-) depression   Current Medication: Outpatient Encounter Medications as of 06/14/2018  Medication Sig Note  . albuterol (PROAIR HFA) 108 (90 Base) MCG/ACT inhaler Inhale 1 puff into the lungs every 4 (four) hours as needed. 09/19/2017: A year ago   . ARIPiprazole (ABILIFY) 5 MG tablet 5 mg daily.    . cetirizine (ZYRTEC) 10 MG tablet Take 10 mg by mouth daily.   Marland Kitchen FLUoxetine (PROZAC) 40 MG capsule  Take 80 mg by mouth daily.    Marland Kitchen gabapentin (NEURONTIN) 400 MG capsule Take 400 mg by mouth 2 (two) times daily.    . nitroGLYCERIN (NITROSTAT) 0.4 MG SL tablet Place 1 tablet under the tongue every 5 (five) minutes as needed. 09/19/2017: Never used'   . traZODone (DESYREL) 150 MG tablet  11/27/2015: Received from: External Pharmacy  . traZODone (DESYREL) 50 MG tablet Take 50 mg by mouth at bedtime. Take 3 tablets    No facility-administered encounter medications on file as of 06/14/2018.     Surgical History: Past Surgical History:  Procedure Laterality Date  . CARPAL TUNNEL RELEASE    . COLONOSCOPY WITH PROPOFOL N/A 09/19/2017   Procedure: COLONOSCOPY WITH PROPOFOL WITH BIOPSIES;  Surgeon: Virgel Manifold, MD;  Location: [facility];  Service: Endoscopy;  Laterality: N/A;  Needs later appt  . POLYPECTOMY N/A 09/19/2017   Procedure: POLYPECTOMY;  Surgeon: Virgel Manifold, MD;  Location: [place];  Service: Endoscopy;  Laterality: N/A;  . TONSILLECTOMY Bilateral 1968    Medical History: Past Medical History:  Diagnosis Date  . Allergy   . Depression   . Hypertension    no longer needs meds  . Thrombocytopenia ([place])     Family History: Family History  Problem Relation Age of Onset  . Breast cancer Mother        67's  . Breast cancer Daughter        50's   . Clotting disorder Daughter     Social History: Social History   Socioeconomic History  .  Marital status: Widowed    Spouse name: Not on file  . Number of children: Not on file  . Years of education: Not on file  . Highest education level: Not on file  Occupational History  . Not on file  Social Needs  . Financial resource strain: Not on file  . Food insecurity:    Worry: Not on file    Inability: Not on file  . Transportation needs:    Medical: Not on file    Non-medical: Not on file  Tobacco Use  . Smoking status: Former Smoker    Packs/day: 1.00    Years: 43.00    Pack years:  43.00    Types: Cigarettes    Last attempt to quit: 04/19/2014    Years since quitting: 4.1  . Smokeless tobacco: Never Used  Substance and Sexual Activity  . Alcohol use: Yes    Comment: Holidays/ socially  . Drug use: No  . Sexual activity: Not on file  Lifestyle  . Physical activity:    Days per week: Not on file    Minutes per session: Not on file  . Stress: Not on file  Relationships  . Social connections:    Talks on phone: Not on file    Gets together: Not on file    Attends religious service: Not on file    Active member of club or organization: Not on file    Attends meetings of clubs or organizations: Not on file    Relationship status: Not on file  . Intimate partner violence:    Fear of current or ex partner: Not on file    Emotionally abused: Not on file    Physically abused: Not on file    Forced sexual activity: Not on file  Other Topics Concern  . Not on file  Social History Narrative  . Not on file    Vital Signs: Blood pressure 112/82, pulse 75, resp. rate 16, height 5\' 2"  (1.575 m), weight 112 lb (50.8 kg), SpO2 98 %.  Examination: General Appearance: The patient is well-developed, well-nourished, and in no distress. Skin: Gross inspection of skin unremarkable. Head: normocephalic, no gross deformities. Eyes: no gross deformities noted. ENT: ears appear grossly normal no exudates. Neck: Supple. No thyromegaly. No LAD. Respiratory: +rhonchi are noted at this time. Cardiovascular: Normal S1 and S2 without murmur or rub. Extremities: No cyanosis. pulses are equal. Neurologic: Alert and oriented. No involuntary movements.  LABS: No results found for this or any previous visit (from the past 2160 hour(s)).  Radiology: Ct Chest Lung Cancer Screening Low Dose Wo Contrast  Result Date: 03/23/2018 CLINICAL DATA:  Initial lung cancer screening. Forty-three pack-year history. Currently asymptomatic. Former smoker. EXAM: CT CHEST WITHOUT CONTRAST LOW-DOSE  FOR LUNG CANCER SCREENING TECHNIQUE: Multidetector CT imaging of the chest was performed following the standard protocol without IV contrast. COMPARISON:  None FINDINGS: Cardiovascular: Heart size is normal. No pericardial effusion. Aortic atherosclerosis. Calcification in the LAD coronary artery noted. Mediastinum/Nodes: No enlarged mediastinal, hilar, or axillary lymph nodes. Thyroid gland, trachea, and esophagus demonstrate no significant findings. Lungs/Pleura: Emphysema. Scar or atelectasis with mild volume loss noted in the lingula. No pulmonary nodules identified. Upper Abdomen: No acute abnormality. Musculoskeletal: Scoliosis deformity identified mild spondylosis. No aggressive lytic or sclerotic bone lesion. IMPRESSION: 1. Lung-RADS 1, negative. Continue annual screening with low-dose chest CT without contrast in 12 months. 2. Aortic Atherosclerosis (ICD10-I70.0) and Emphysema (ICD10-J43.9). Electronically Signed   By: Queen Slough.D.  On: 03/23/2018 14:00    No results found.  No results found.    Assessment and Plan: Patient Active Problem List   Diagnosis Date Noted  . Personal history of tobacco use, presenting hazards to health 03/23/2018  . Abnormal feces   . Benign neoplasm of ascending colon   . Hemorrhoids without complication   . Benign neoplasm of rectosigmoid junction   . 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 SOB she may have COPD based on the spirometry. I will need to get a PFT done to check reporducibility of the test. She is also going to see Dr Raul Del for a second opinion. I gave her sample of breo today 2. H/o Smoking she is currently not smoking  3. She may need to be on inhalers regularly. She states she has one but does not use it regullarly gave her sample of breo  General Counseling: I have discussed the findings of the evaluation and examination with Huberta.  I have also discussed any further diagnostic evaluation thatmay be needed  or ordered today. Jailah verbalizes understanding of the findings of todays visit. We also reviewed her medications today and discussed drug interactions and side effects including but not limited excessive drowsiness and altered mental states. We also discussed that there is always a risk not just to her but also people around her. she has been encouraged to call the office with any questions or concerns that should arise related to todays visit.    Time spent: 25min  I have personally obtained a history, examined the patient, evaluated laboratory and imaging results, formulated the assessment and plan and placed orders.    Allyne Gee, MD [facility] Pulmonary and Critical Care Sleep medicine

## 2018-06-14 NOTE — Patient Instructions (Signed)

## 2018-06-20 ENCOUNTER — Ambulatory Visit: Payer: Medicare Other | Admitting: Internal Medicine

## 2018-06-20 ENCOUNTER — Other Ambulatory Visit: Payer: Self-pay

## 2018-06-20 ENCOUNTER — Telehealth: Payer: Self-pay | Admitting: Internal Medicine

## 2018-06-20 DIAGNOSIS — R0602 Shortness of breath: Secondary | ICD-10-CM

## 2018-06-20 LAB — PULMONARY FUNCTION TEST

## 2018-06-20 NOTE — Telephone Encounter (Signed)
Oxygen orders sent to [facility]

## 2018-06-24 NOTE — Procedures (Signed)
[facility] [place], [address]  DATE OF SERVICE: June 20, 2018  Complete Pulmonary Function Testing Interpretation:  FINDINGS:  The forced vital capacity is moderately decreased.  The FEV1 is 0.79 L which is 36% of predicted and is severely decreased.  Postbronchodilator there is no significant change in the FEV1 clinical improvement may still occur in the absence of spirometric improvement.  FEV1 FVC ratio is moderately decreased.  Total lung capacity is normal residual volume is increased residual volume total lung capacity ratio is increased total gas volume is normal.  The DLCO is moderately decreased.  IMPRESSION:  This pulmonary function study is consistent with severe obstructive lung disease.  No response to bronchodilators however clinical improvement may occur in the absence of spirometric improvement.  DLCO is moderately decreased clinical correlation is recommended.  Allyne Gee, MD [facility] Pulmonary Critical Care Medicine Sleep Medicine

## 2018-06-25 ENCOUNTER — Ambulatory Visit: Payer: Medicare Other | Admitting: Internal Medicine

## 2018-06-25 ENCOUNTER — Other Ambulatory Visit: Payer: Self-pay

## 2018-06-25 ENCOUNTER — Encounter: Payer: Self-pay | Admitting: Internal Medicine

## 2018-06-25 VITALS — BP 122/82 | HR 65 | Resp 16 | Ht 62.0 in | Wt 116.0 lb

## 2018-06-25 DIAGNOSIS — J449 Chronic obstructive pulmonary disease, unspecified: Secondary | ICD-10-CM | POA: Diagnosis not present

## 2018-06-25 NOTE — Progress Notes (Signed)
[facility] [place], [place] [address]  Internal MEDICINE  Office Visit Note  Patient Name: Nancy Love  [id number]  [id number]  Date of Service: 06/25/2018  Chief Complaint  Patient presents with  . Follow-up    review pft and dmv paperwork    HPI  Pt is here to get her paper work for [facility] and results of her recent PFT Patient has a history of DWI over 15 years ago. She has not been driving. Patient states that in order to get her license back she needs to be evalauted. She states that she smokes quit about a 1.5 years ago. She smoked about 2PPD for over 40 years but did taper down towards the end. She states that she does experience SOB when she exerts herself. She states that she has been previously told she has COPD. Patient states that she is also supposed to see Dr Raul Del for a second opinion. She cannot have her ingnigiton interlock device removed until the pulmonary function is evaluated. She states she is not on oxygen has never been evaluated. She states that she still drinks socially.  Current Medication: Outpatient Encounter Medications as of 06/25/2018  Medication Sig Note  . albuterol (PROAIR HFA) 108 (90 Base) MCG/ACT inhaler Inhale 1 puff into the lungs every 4 (four) hours as needed. 09/19/2017: A year ago   . ARIPiprazole (ABILIFY) 5 MG tablet 5 mg daily.    . cetirizine (ZYRTEC) 10 MG tablet Take 10 mg by mouth daily.   Marland Kitchen FLUoxetine (PROZAC) 40 MG capsule Take 80 mg by mouth daily.    . fluticasone furoate-vilanterol (BREO ELLIPTA) 200-25 MCG/INH AEPB Inhale 1 puff into the lungs daily.   Marland Kitchen gabapentin (NEURONTIN) 400 MG capsule Take 400 mg by mouth 2 (two) times daily.    . nitroGLYCERIN (NITROSTAT) 0.4 MG SL tablet Place 1 tablet under the tongue every 5 (five) minutes as needed. 09/19/2017: Never used'   . traZODone (DESYREL) 150 MG tablet  11/27/2015: Received from: External Pharmacy  . traZODone (DESYREL) 50 MG tablet Take 50 mg by mouth  at bedtime. Take 3 tablets    No facility-administered encounter medications on file as of 06/25/2018.     Surgical History: Past Surgical History:  Procedure Laterality Date  . CARPAL TUNNEL RELEASE    . COLONOSCOPY WITH PROPOFOL N/A 09/19/2017   Procedure: COLONOSCOPY WITH PROPOFOL WITH BIOPSIES;  Surgeon: Virgel Manifold, MD;  Location: [facility];  Service: Endoscopy;  Laterality: N/A;  Needs later appt  . POLYPECTOMY N/A 09/19/2017   Procedure: POLYPECTOMY;  Surgeon: Virgel Manifold, MD;  Location: [facility];  Service: Endoscopy;  Laterality: N/A;  . TONSILLECTOMY Bilateral 1968    Medical History: Past Medical History:  Diagnosis Date  . Allergy   . Depression   . Hypertension    no longer needs meds  . Thrombocytopenia ([place])     Family History: Family History  Problem Relation Age of Onset  . Breast cancer Mother        64's  . Breast cancer Daughter        86's   . Clotting disorder Daughter     Social History   Socioeconomic History  . Marital status: Widowed    Spouse name: Not on file  . Number of children: Not on file  . Years of education: Not on file  . Highest education level: Not on file  Occupational History  . Not on file  Social Needs  . Financial resource strain: Not on file  . Food insecurity    Worry: Not on file    Inability: Not on file  . Transportation needs    Medical: Not on file    Non-medical: Not on file  Tobacco Use  . Smoking status: Former Smoker    Packs/day: 1.00    Years: 43.00    Pack years: 43.00    Types: Cigarettes    Quit date: 04/19/2014    Years since quitting: 4.1  . Smokeless tobacco: Never Used  Substance and Sexual Activity  . Alcohol use: Yes    Comment: Holidays/ socially  . Drug use: No  . Sexual activity: Not on file  Lifestyle  . Physical activity    Days per week: Not on file    Minutes per session: Not on file  . Stress: Not on file  Relationships  . Social  Herbalist on phone: Not on file    Gets together: Not on file    Attends religious service: Not on file    Active member of club or organization: Not on file    Attends meetings of clubs or organizations: Not on file    Relationship status: Not on file  . Intimate partner violence    Fear of current or ex partner: Not on file    Emotionally abused: Not on file    Physically abused: Not on file    Forced sexual activity: Not on file  Other Topics Concern  . Not on file  Social History Narrative  . Not on file     Review of Systems  Constitutional: Negative for fatigue.  Eyes: Negative for photophobia, discharge, redness, itching and visual disturbance.  Respiratory: Positive for shortness of breath and wheezing. Negative for cough.   Cardiovascular: Negative for chest pain and palpitations.  Genitourinary: Positive for dysuria.  Allergic/Immunologic: Positive for environmental allergies.  Neurological: Negative for dizziness and headaches.  Hematological: Does not bruise/bleed easily.  Psychiatric/Behavioral: Negative for agitation, behavioral problems (depression) and hallucinations.    Vital Signs: BP 122/82   Pulse 65   Resp 16   Ht 5\' 2"  (1.575 m)   Wt 116 lb (52.6 kg)   SpO2 94%   BMI 21.22 kg/m    Physical Exam Constitutional:      Appearance: Normal appearance.  Cardiovascular:     Rate and Rhythm: Normal rate and regular rhythm.  Pulmonary:     Effort: Pulmonary effort is normal.     Breath sounds: Wheezing present.  Neurological:     Mental Status: She is alert.    Assessment/Plan: 1. Obstructive chronic bronchitis without exacerbation (HCC) PFT's results are as follows  pulmonary function study is consistent with severe  obstructive lung disease. No response to bronchodilators however  clinical improvement may occur in the absence of spirometric  improvement. DLCO is moderately decreased clinical correlation  is recommended, Continue  with all medications as before. DMV paper work is completed and given to the pt  General Counseling: Jasimine verbalizes understanding of the findings of todays visit and agrees with plan of treatment. I have discussed any further diagnostic evaluation that may be needed or ordered today. We also reviewed her medications today. she has been encouraged to call the office with any questions or concerns that should arise related to todays visit.   Time spent:25 Minutes  Dr Lavera Guise Internal medicine

## 2018-06-25 NOTE — Telephone Encounter (Signed)
Pt is calling to schedule  Procedure for maybe in a week

## 2018-06-27 ENCOUNTER — Other Ambulatory Visit: Payer: Self-pay

## 2018-06-27 DIAGNOSIS — Z1211 Encounter for screening for malignant neoplasm of colon: Secondary | ICD-10-CM

## 2018-06-27 DIAGNOSIS — Z86010 Personal history of colonic polyps: Secondary | ICD-10-CM

## 2018-06-27 NOTE — Telephone Encounter (Signed)
Colonoscopy scheduled for 08/07/18.  Dr. Bonna Gains, I advised her you requested Dr. Allen Norris to do her procedure due to the polyp in her ascending colon and she does not want him to do it. She said she only wants you to do this. I went ahead and set her up with you. If this is a problem, please let me know.

## 2018-07-05 ENCOUNTER — Telehealth: Payer: Self-pay | Admitting: Internal Medicine

## 2018-07-05 NOTE — Telephone Encounter (Signed)
Per [facility] patient was unavailable for Oxygen setup on the date she requested, they are unable to reach pt to reschedule oxygen setup and I have left messages for pt, she does not have follow up with Dr Devona Konig and he believes she needs the O2 due to her testing results. Nancy Love

## 2018-07-11 NOTE — Telephone Encounter (Signed)
Pt has been rescheduled to 08/02/18 with Dr. Allen Norris.

## 2018-07-23 ENCOUNTER — Other Ambulatory Visit: Payer: Self-pay

## 2018-07-23 ENCOUNTER — Telehealth: Payer: Self-pay | Admitting: Gastroenterology

## 2018-07-23 MED ORDER — SUPREP BOWEL PREP KIT 17.5-3.13-1.6 GM/177ML PO SOLN: 1.0000 | ORAL | 0 refills | Status: DC

## 2018-07-23 NOTE — Telephone Encounter (Signed)
Pt is calling  She has  A procedure  08/02/18 she needs rx sent to [facility]  For rx Suprep

## 2018-07-23 NOTE — Telephone Encounter (Signed)
Prescription has been sent to [place] per pt request.

## 2018-07-26 ENCOUNTER — Encounter: Payer: Self-pay | Admitting: Anesthesiology

## 2018-07-30 ENCOUNTER — Other Ambulatory Visit
Admission: RE | Admit: 2018-07-30 | Discharge: 2018-07-30 | Disposition: A | Discharge status: Home / Self Care | Payer: Medicare Other | Source: Ambulatory Visit | Attending: Gastroenterology | Admitting: Gastroenterology

## 2018-07-30 ENCOUNTER — Other Ambulatory Visit: Payer: Self-pay

## 2018-07-30 DIAGNOSIS — Z1159 Encounter for screening for other viral diseases: Secondary | ICD-10-CM | POA: Insufficient documentation

## 2018-07-30 LAB — SARS CORONAVIRUS 2 (TAT 6-24 HRS): SARS Coronavirus 2: NEGATIVE

## 2018-08-01 ENCOUNTER — Encounter: Payer: Self-pay | Admitting: *Deleted

## 2018-08-01 NOTE — Discharge Instructions (Signed)

## 2018-08-01 NOTE — Anesthesia Preprocedure Evaluation (Deleted)
Anesthesia Evaluation    Airway        Dental   Pulmonary COPD, former smoker (quit 2016),           Cardiovascular hypertension,      Neuro/Psych PSYCHIATRIC DISORDERS Depression    GI/Hepatic   Endo/Other    Renal/GU      Musculoskeletal   Abdominal   Peds  Hematology  (+) Blood dyscrasia (thrombocytopenia), ,   Anesthesia Other Findings   Reproductive/Obstetrics                             Anesthesia Physical Anesthesia Plan  ASA: III  Anesthesia Plan: General   Post-op Pain Management:    Induction: Intravenous  PONV Risk Score and Plan: 3 and Propofol infusion and TIVA  Airway Management Planned: Natural Airway  Additional Equipment:   Intra-op Plan:   Post-operative Plan:   Informed Consent:   Plan Discussed with:   Anesthesia Plan Comments:         Anesthesia Quick Evaluation

## 2018-08-02 ENCOUNTER — Ambulatory Visit: Admission: RE | Admit: 2018-08-02 | Payer: Medicare Other | Source: Home / Self Care | Admitting: Gastroenterology

## 2018-08-02 HISTORY — DX: Chronic obstructive pulmonary disease, unspecified: J44.9

## 2018-08-02 SURGERY — COLONOSCOPY WITH PROPOFOL
Anesthesia: General

## 2018-08-08 ENCOUNTER — Telehealth: Payer: Self-pay | Admitting: Gastroenterology

## 2018-08-08 NOTE — Telephone Encounter (Signed)
Patient would like Ginger to call her & r/s her procedure.

## 2018-08-09 NOTE — Telephone Encounter (Signed)
Spoke with pt and she will call back when she has a calender to schedule her procedure.

## 2018-08-20 ENCOUNTER — Other Ambulatory Visit: Payer: Self-pay

## 2018-08-20 DIAGNOSIS — D696 Thrombocytopenia, unspecified: Secondary | ICD-10-CM

## 2018-08-20 DIAGNOSIS — D693 Immune thrombocytopenic purpura: Secondary | ICD-10-CM

## 2018-08-21 NOTE — Progress Notes (Signed)
[facility]  [address], [address] [place], [place] [address] Phone: [phone]  Fax: [phone]   Clinic Day:  08/23/2018  Referring physician: Ellamae Sia, MD  Chief Complaint: Nancy Love is a 68 y.o. female with a history of idiopathic thrombocytopenic purpura (ITP) who is seen for new patient assessment.   HPI: The patient was diagnosed with idiopathic thrombocytopenic purpura (ITP) in 2011 from an incidental finding of low platelets at [facility].   The patient was initially seen by Dr. Grayland Ormond on 06/28/2015.    Work up on 06/19/2015 revealed a hematocrit 43.8, hemoglobin 14.4, MCV 93.1, platelets 30,000, WBC 9,500.  Ferritin was 52 with an iron saturation 19% and a TBIC 364. Folate was 9.4. Vitamin B-12 was 231 (low). LDH 168. Haptoglobin was 92. PTT 29.  ANA was positive (ribonucleic protein 1.2).  M-spike was 0. PT/INR was 13.7/1.03 on 07/21/2015.  Bone marrow biopsy on 07/21/2015 revealed no morphologic evidence of hematopoietic neoplasia.  Marrow was normocellular to slightly hypercellular for age (40-50%) with relative myeloid hyperplasia, mild nonspecific dyserythropoiesis and mildly atypical megakaryocytic hyperplasia.  There was patchy mild to focally moderate increase in reticulin.  Storage iron was present.  Flow cytometry revealed no abnormality.  Cytogenetics were normal (54, XX).  Alterations with were felt to correlate with an immune mediated thrombocytopenia with secondary/compensatory megakaryocytic hyperplasia and treatment effect.  She initially received prednisone (unknown dose).  Platelet count did not improve.  She received Rituxan 08/24/2015 - 09/18/2015.  Pre treatment platelet count was 78,000, post-treatment platelet count was 42,000, and platelet count 1 month later was 40,000.  The patient was last seen in the hematology clinic on 02/23/2018 by Dr. Grayland Ormond. She was asymptomatic. Her appetite was good. Exam was stable.  Hematocrit 44.9, hemoglobin 14.6, MCV 90.7, platelets 61,000, and WBC 11,300.  Notes indicate Nplate injections were denied by her insurance unless her platelet count was < 20,000 or she had evidence of bleeding.  Continued surveillance was recommended.  She has a family history of breast cancer.  Her mother had breast cancer in her 82s; her daughter had breast cancer in her 75s.  Her daughter also has a history of pulmonary embolism.  Bilateral mammogram on 05/24/2017 revealed no evidence of malignancy.   The patient has a history of COPD and she is using Brio and Pro-Air. Chest CT for lung screening on 03/23/2018 revealed lung-RADS 1, negative. Continue annual screening with low-doses chest CT without contrast in 12 months.  Symptomatically, she feels "fine".  She has arthritis in her hands, back, shoulders, and neck. She has trouble getting out of bed in the mornings. She notes bruising easily. She denies having any exposure to hepatitis or HIV. She has never had a transfusion.   She notes having hemorrhoids. She notes joint pain. She denies any skin changes.   She denies the use of any use of herbal products. She notes a medication change; Abilify was added to her Prozac. She has been on Prozac for several years.  She is due for another colonoscopy within the next month with Dr. Allen Norris.  She has never had an abdominal ultrasound.    Past Medical History:  Diagnosis Date  . Allergy   . COPD (chronic obstructive pulmonary disease) ([place])   . Depression   . Hypertension    no longer needs meds  . Thrombocytopenia ([place])     Past Surgical History:  Procedure Laterality Date  . CARPAL TUNNEL RELEASE    .  COLONOSCOPY WITH PROPOFOL N/A 09/19/2017   Procedure: COLONOSCOPY WITH PROPOFOL WITH BIOPSIES;  Surgeon: Virgel Manifold, MD;  Location: [place];  Service: Endoscopy;  Laterality: N/A;  Needs later appt  . POLYPECTOMY N/A 09/19/2017   Procedure: POLYPECTOMY;  Surgeon:  Virgel Manifold, MD;  Location: [place];  Service: Endoscopy;  Laterality: N/A;  . TONSILLECTOMY Bilateral 1968    Family History  Problem Relation Age of Onset  . Breast cancer Mother        10's  . Breast cancer Daughter        8's   . Clotting disorder Daughter     Social History:  reports that she quit smoking about 4 years ago. Her smoking use included cigarettes. She has a 43.00 pack-year smoking history. She has never used smokeless tobacco. She reports current alcohol use. She reports that she does not use drugs.She denies any exposure to radiation and toxins.  She initially smoked 2 packs/day, last several years smoked a pack/day, then 1 pack every 3 days. She has quit smoking  1.5-2 years ago. She drinks alcohol socially.  She lives in [place].  The patient is alone today.  Allergies: No Known Allergies  Current Medications: Current Outpatient Medications  Medication Sig Dispense Refill  . albuterol (PROAIR HFA) 108 (90 Base) MCG/ACT inhaler Inhale 1 puff into the lungs every 4 (four) hours as needed.    . ARIPiprazole (ABILIFY) 5 MG tablet 5 mg daily.   1  . cetirizine (ZYRTEC) 10 MG tablet Take 10 mg by mouth daily.    Marland Kitchen FLUoxetine (PROZAC) 40 MG capsule Take 80 mg by mouth daily.   2  . fluticasone furoate-vilanterol (BREO ELLIPTA) 200-25 MCG/INH AEPB Inhale 1 puff into the lungs daily. 1 each 5  . gabapentin (NEURONTIN) 400 MG capsule Take 400 mg by mouth 2 (two) times daily.   2  . traZODone (DESYREL) 50 MG tablet Take 50 mg by mouth at bedtime. Take 3 tablets    . nitroGLYCERIN (NITROSTAT) 0.4 MG SL tablet Place 1 tablet under the tongue every 5 (five) minutes as needed.     No current facility-administered medications for this visit.     Review of Systems  Constitutional: Negative for chills, fever, malaise/fatigue and weight loss.       Feels "fine".  HENT: Negative for congestion, ear discharge, ear pain, hearing loss, nosebleeds, sinus pain,  sore throat and tinnitus.   Eyes: Negative for blurred vision and double vision.  Respiratory: Negative for cough, hemoptysis, sputum production, shortness of breath and stridor.        COPD.  Cardiovascular: Negative for chest pain, palpitations and leg swelling.  Gastrointestinal: Negative for abdominal pain, blood in stool, constipation, diarrhea, melena, nausea and vomiting.       Colonoscopy planned.  Genitourinary: Negative for dysuria, frequency, hematuria and urgency.       Hemorrhoids.  Musculoskeletal: Positive for joint pain (arthitis ). Negative for back pain, myalgias and neck pain.  Skin: Negative for itching and rash.  Neurological: Negative for dizziness, tingling, sensory change, focal weakness and headaches.  Endo/Heme/Allergies: Bruises/bleeds easily.  Psychiatric/Behavioral: Positive for depression (improving). Negative for memory loss. The patient is not nervous/anxious and does not have insomnia.   All other systems reviewed and are negative.  Performance status (ECOG): 0  Vitals Blood pressure 121/63, pulse 78, temperature 97.8 F (36.6 C), temperature source Oral, resp. rate 18, weight 115 lb 8.3 oz (52.4 kg), SpO2 98 %.  Physical Exam  Constitutional: She is oriented to person, place, and time. She appears well-developed and well-nourished. No distress.  HENT:  Head: Normocephalic and atraumatic.  Mouth/Throat: Oropharynx is clear and moist. No oropharyngeal exudate.  Long hair. Torus.  Eyes: Pupils are equal, round, and reactive to light. Conjunctivae and EOM are normal. No scleral icterus.  Neck: Normal range of motion. Neck supple. No JVD present.  Cardiovascular: Normal rate, regular rhythm and normal heart sounds. Exam reveals no gallop and no friction rub.  No murmur heard. Pulmonary/Chest: Effort normal and breath sounds normal. No respiratory distress. She has no wheezes. She has no rales. She exhibits no tenderness.  Abdominal: Soft. Bowel sounds  are normal. She exhibits no distension and no mass. There is no abdominal tenderness. There is no rebound and no guarding.  ? spleen palpable.  Musculoskeletal: Normal range of motion.        General: No tenderness or edema.     Comments: Clubbing.  Lymphadenopathy:    She has no cervical adenopathy.    She has no axillary adenopathy.       Right: No supraclavicular adenopathy present.       Left: No supraclavicular adenopathy present.  Neurological: She is alert and oriented to person, place, and time. She displays tremor.  Right sided tremor.  Skin: Skin is warm and dry. No rash noted. She is not diaphoretic. No erythema. No pallor.  Spider angioma.  No petechiae.  Psychiatric: She has a normal mood and affect. Her behavior is normal. Judgment and thought content normal.  Nursing note and vitals reviewed.   Appointment on 08/23/2018  Component Date Value Ref Range Status  . Sodium 08/23/2018 139  135 - 145 mmol/L Final  . Potassium 08/23/2018 3.5  3.5 - 5.1 mmol/L Final  . Chloride 08/23/2018 106  98 - 111 mmol/L Final  . CO2 08/23/2018 27  22 - 32 mmol/L Final  . Glucose, Bld 08/23/2018 107* 70 - 99 mg/dL Final  . BUN 08/23/2018 11  8 - 23 mg/dL Final  . Creatinine, Ser 08/23/2018 0.71  0.44 - 1.00 mg/dL Final  . Calcium 08/23/2018 9.5  8.9 - 10.3 mg/dL Final  . Total Protein 08/23/2018 7.1  6.5 - 8.1 g/dL Final  . Albumin 08/23/2018 3.9  3.5 - 5.0 g/dL Final  . AST 08/23/2018 24  15 - 41 U/L Final  . ALT 08/23/2018 18  0 - 44 U/L Final  . Alkaline Phosphatase 08/23/2018 80  38 - 126 U/L Final  . Total Bilirubin 08/23/2018 0.6  0.3 - 1.2 mg/dL Final  . GFR calc non Af Amer 08/23/2018 >60  >60 mL/min Final  . GFR calc Af Amer 08/23/2018 >60  >60 mL/min Final  . Anion gap 08/23/2018 6  5 - 15 Final   Performed at [facility] [place], [address], [place], [place] [address]  . WBC 08/23/2018 9.6  4.0 - 10.5 K/uL Final  . RBC 08/23/2018 4.66  3.87 - 5.11 MIL/uL Final   . Hemoglobin 08/23/2018 13.9  12.0 - 15.0 g/dL Final  . HCT 08/23/2018 42.7  36.0 - 46.0 % Final  . MCV 08/23/2018 91.6  80.0 - 100.0 fL Final  . MCH 08/23/2018 29.8  26.0 - 34.0 pg Final  . MCHC 08/23/2018 32.6  30.0 - 36.0 g/dL Final  . RDW 08/23/2018 13.2  11.5 - 15.5 % Final  . Platelets 08/23/2018 39* 150 - 400 K/uL Final   Comment: Immature Platelet  Fraction may be clinically indicated, consider ordering this additional test [id number]   . nRBC 08/23/2018 0.0  0.0 - 0.2 % Final   Performed at [facility], [address], [place], [place] [address]  . Neutrophils Relative % 08/23/2018 PENDING  % Incomplete  . Neutro Abs 08/23/2018 PENDING  1.7 - 7.7 K/uL Incomplete  . Band Neutrophils 08/23/2018 PENDING  % Incomplete  . Lymphocytes Relative 08/23/2018 PENDING  % Incomplete  . Lymphs Abs 08/23/2018 PENDING  0.7 - 4.0 K/uL Incomplete  . Monocytes Relative 08/23/2018 PENDING  % Incomplete  . Monocytes Absolute 08/23/2018 PENDING  0.1 - 1.0 K/uL Incomplete  . Eosinophils Relative 08/23/2018 PENDING  % Incomplete  . Eosinophils Absolute 08/23/2018 PENDING  0.0 - 0.5 K/uL Incomplete  . Basophils Relative 08/23/2018 PENDING  % Incomplete  . Basophils Absolute 08/23/2018 PENDING  0.0 - 0.1 K/uL Incomplete  . WBC Morphology 08/23/2018 PENDING   Incomplete  . RBC Morphology 08/23/2018 PENDING   Incomplete  . Smear Review 08/23/2018 PENDING   Incomplete  . Other 08/23/2018 PENDING  % Incomplete  . nRBC 08/23/2018 PENDING  0 /100 WBC Incomplete  . Metamyelocytes Relative 08/23/2018 PENDING  % Incomplete  . Myelocytes 08/23/2018 PENDING  % Incomplete  . Promyelocytes Relative 08/23/2018 PENDING  % Incomplete  . Blasts 08/23/2018 PENDING  % Incomplete    Assessment:  Nancy Love is a 68 y.o. 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  She was unresponsive to steroids and Rituxan.  Work up on 06/19/2015:  hematocrit 43.8, hemoglobin 14.4, MCV 93.1,  platelets 30,000, WBC 9,500.  Ferritin was 52 with an iron saturation 19% and a TBIC 364. Folate was 9.4. Vitamin B-12 was 231 (low). LDH 168. Haptoglobin was 92. PTT 29.  ANA was positive (ribonucleic protein 1.2). M-spike was 0.  Bone marrow biopsy on 07/21/2015 revealed no morphologic evidence of hematopoietic neoplasia.  Marrow was normocellular to slightly hypercellular for age (40-50%) with relative myeloid hyperplasia, mild nonspecific dyserythropoiesis and mildly atypical megakaryocytic hyperplasia.  There was patchy mild to focally moderate increase in reticulin.  Storage iron was present.  Flow cytometry revealed no abnormality.  Cytogenetics were normal (70, XX).  Alterations with were felt to correlate with an immune mediated thrombocytopenia with secondary/compensatory megakaryocytic hyperplasia and treatment effect.  She received Rituxan 08/24/2015 - 09/18/2015.  She was unresponsive with a platelet count of 78,000 pre-treatment, 42,000 post-treatment and 40,000 1 month later.    She has COPD.  Low dose chest CT on 03/23/2018 revealed lung-RADS 1, negative.  She has a family history of breast cancer (mother 30s and daughter 55s).  Bilateral mammogram on 05/24/2017 revealed no evidence of malignancy.   Symptomatically, she notes arthritis.  She denies any excess bruising or bleeding.  Plan: 1.   Labs today:  CBC with diff, CMP, B12, folate, TSH, hepatitis B core antibody total, hepatitis B surface antigen, hepatitis C antibody, HIV testing (patient consents) 2.   Immune mediated thrombocytopenic purpura (ITP)  Review entire medical history, diagnosis and management of ITP.   Discuss prior poor response to steroids and Rituxan (unusual).  Discuss initial bone marrow.  Discuss ultrasound to assess liver and spleen.  Discuss consideration of repeat bone marrow if treatment needed.  Per prior notes, Nplate denied by insurance unless platelets < 20,000. 3.   B12 deficiency  Patient has  a history of low B12 (231) in 2017.  Discuss repeating B12 today and likely initiation of oral B12.  If B12 low today, plan on checking B12 level after 1 month of oral B12. 4.   Family history of breast cancer  Patient's last mammogram was in 05/2017.  Encourage follow-up mammogram. 5.   Abdominal ultrasound tomorrow. 6.   RTC in 1 week for MD assessment and review of work-up and discussion regarding direction of therapy.   I discussed the assessment and treatment plan with the patient.  The patient was provided an opportunity to ask questions and all were answered.  The patient agreed with the plan and demonstrated an understanding of the instructions.  The patient was advised to call back if the symptoms worsen or if the condition fails to improve as anticipated.  I provided 24 minutes of face-to-face time during this this encounter and > 50% was spent counseling as documented under my assessment and plan.    Melissa C. Mike Gip, MD, PhD    08/23/2018, 1:57 PM  I, Selena Batten, am acting as scribe for [employer]. Mike Gip, MD, PhD.  I, Melissa C. Mike Gip, MD, have reviewed the above documentation for accuracy and completeness, and I agree with the above.

## 2018-08-22 ENCOUNTER — Other Ambulatory Visit: Payer: Self-pay

## 2018-08-22 DIAGNOSIS — D122 Benign neoplasm of ascending colon: Secondary | ICD-10-CM

## 2018-08-23 ENCOUNTER — Inpatient Hospital Stay: Payer: Medicare Other | Attending: Hematology and Oncology | Admitting: Hematology and Oncology

## 2018-08-23 ENCOUNTER — Other Ambulatory Visit: Payer: Self-pay | Admitting: Hematology and Oncology

## 2018-08-23 ENCOUNTER — Inpatient Hospital Stay: Payer: Medicare Other | Attending: Hematology and Oncology

## 2018-08-23 ENCOUNTER — Encounter: Payer: Self-pay | Admitting: Hematology and Oncology

## 2018-08-23 VITALS — BP 121/63 | HR 78 | Temp 97.8°F | Resp 18 | Wt 115.5 lb

## 2018-08-23 DIAGNOSIS — E538 Deficiency of other specified B group vitamins: Secondary | ICD-10-CM

## 2018-08-23 DIAGNOSIS — M1990 Unspecified osteoarthritis, unspecified site: Secondary | ICD-10-CM | POA: Insufficient documentation

## 2018-08-23 DIAGNOSIS — D693 Immune thrombocytopenic purpura: Secondary | ICD-10-CM

## 2018-08-23 DIAGNOSIS — Z87891 Personal history of nicotine dependence: Secondary | ICD-10-CM | POA: Insufficient documentation

## 2018-08-23 DIAGNOSIS — I10 Essential (primary) hypertension: Secondary | ICD-10-CM | POA: Insufficient documentation

## 2018-08-23 DIAGNOSIS — Z7951 Long term (current) use of inhaled steroids: Secondary | ICD-10-CM | POA: Diagnosis not present

## 2018-08-23 DIAGNOSIS — J449 Chronic obstructive pulmonary disease, unspecified: Secondary | ICD-10-CM | POA: Diagnosis not present

## 2018-08-23 DIAGNOSIS — Z79899 Other long term (current) drug therapy: Secondary | ICD-10-CM | POA: Insufficient documentation

## 2018-08-23 DIAGNOSIS — Z803 Family history of malignant neoplasm of breast: Secondary | ICD-10-CM | POA: Insufficient documentation

## 2018-08-23 DIAGNOSIS — D696 Thrombocytopenia, unspecified: Secondary | ICD-10-CM

## 2018-08-23 DIAGNOSIS — D122 Benign neoplasm of ascending colon: Secondary | ICD-10-CM

## 2018-08-23 DIAGNOSIS — R7989 Other specified abnormal findings of blood chemistry: Secondary | ICD-10-CM

## 2018-08-23 LAB — COMPREHENSIVE METABOLIC PANEL
ALT: 18 U/L (ref 0–44)
AST: 24 U/L (ref 15–41)
Albumin: 3.9 g/dL (ref 3.5–5.0)
Alkaline Phosphatase: 80 U/L (ref 38–126)
Anion gap: 6 (ref 5–15)
BUN: 11 mg/dL (ref 8–23)
CO2: 27 mmol/L (ref 22–32)
Calcium: 9.5 mg/dL (ref 8.9–10.3)
Chloride: 106 mmol/L (ref 98–111)
Creatinine, Ser: 0.71 mg/dL (ref 0.44–1.00)
GFR calc Af Amer: >60 mL/min (ref >60)
GFR calc non Af Amer: >60 mL/min (ref >60)
Glucose, Bld: 107 mg/dL — ABNORMAL HIGH (ref 70–99)
Potassium: 3.5 mmol/L (ref 3.5–5.1)
Sodium: 139 mmol/L (ref 135–145)
Total Bilirubin: 0.6 mg/dL (ref 0.3–1.2)
Total Protein: 7.1 g/dL (ref 6.5–8.1)

## 2018-08-23 LAB — CBC WITH DIFFERENTIAL/PLATELET
Abs Immature Granulocytes: 0.07 10*3/uL (ref 0.00–0.07)
Basophils Absolute: 0.1 10*3/uL (ref 0.0–0.1)
Basophils Relative: 1 %
Eosinophils Absolute: 0.4 10*3/uL (ref 0.0–0.5)
Eosinophils Relative: 4 %
HCT: 42.7 % (ref 36.0–46.0)
Hemoglobin: 13.9 g/dL (ref 12.0–15.0)
Immature Granulocytes: 1 %
Lymphocytes Relative: 14 %
Lymphs Abs: 1.4 10*3/uL (ref 0.7–4.0)
MCH: 29.8 pg (ref 26.0–34.0)
MCHC: 32.6 g/dL (ref 30.0–36.0)
MCV: 91.6 fL (ref 80.0–100.0)
Monocytes Absolute: 0.6 10*3/uL (ref 0.1–1.0)
Monocytes Relative: 6 %
Neutro Abs: 7.1 10*3/uL (ref 1.7–7.7)
Neutrophils Relative %: 74 %
Platelets: 39 10*3/uL — ABNORMAL LOW (ref 150–400)
RBC: 4.66 MIL/uL (ref 3.87–5.11)
RDW: 13.2 % (ref 11.5–15.5)
WBC: 9.6 10*3/uL (ref 4.0–10.5)
nRBC: 0 % (ref 0.0–0.2)

## 2018-08-23 LAB — VITAMIN B12: Vitamin B-12: 302 pg/mL (ref 180–914)

## 2018-08-23 LAB — FOLATE: Folate: 9.1 ng/mL (ref >5.9)

## 2018-08-23 LAB — TSH: TSH: 1.717 u[IU]/mL (ref 0.350–4.500)

## 2018-08-23 NOTE — Progress Notes (Signed)
Pt here for follow up. Previous Dr. Finnegan patient. Denies any concerns.  

## 2018-08-24 ENCOUNTER — Other Ambulatory Visit: Payer: Self-pay

## 2018-08-24 ENCOUNTER — Telehealth: Payer: Self-pay

## 2018-08-24 ENCOUNTER — Ambulatory Visit
Admission: RE | Admit: 2018-08-24 | Discharge: 2018-08-24 | Disposition: A | Discharge status: Home / Self Care | Payer: Medicare Other | Source: Ambulatory Visit | Attending: Hematology and Oncology | Admitting: Hematology and Oncology

## 2018-08-24 DIAGNOSIS — D693 Immune thrombocytopenic purpura: Secondary | ICD-10-CM | POA: Insufficient documentation

## 2018-08-24 LAB — HEPATITIS C ANTIBODY: HCV Ab: 0.1 s/co ratio (ref 0.0–0.9)

## 2018-08-24 LAB — HEPATITIS B SURFACE ANTIGEN: Hepatitis B Surface Ag: NEGATIVE

## 2018-08-24 LAB — HIV ANTIBODY (ROUTINE TESTING W REFLEX): HIV Screen 4th Generation wRfx: NONREACTIVE

## 2018-08-24 LAB — HEPATITIS B CORE ANTIBODY, TOTAL: Hep B Core Total Ab: NEGATIVE

## 2018-08-24 NOTE — Telephone Encounter (Signed)
Spoke with [place] Anderson Malta) which informed me of the result for this patient. Ms Anderson Malta report the patient had no splenomegaly or hepatomegaly noted on the [place]. 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

## 2018-08-27 ENCOUNTER — Other Ambulatory Visit: Payer: Self-pay

## 2018-08-27 ENCOUNTER — Telehealth: Payer: Self-pay

## 2018-08-27 DIAGNOSIS — E538 Deficiency of other specified B group vitamins: Secondary | ICD-10-CM

## 2018-08-27 NOTE — Telephone Encounter (Signed)
-----   Message from Lequita Asal, MD sent at 08/24/2018  5:26 PM EDT ----- Regarding: Please call patient  B12 is low.  Begin oral B12.  Recheck B12 level in 1 month.  M ----- Message ----- From: Buel Ream, Lab In [place] Sent: 08/23/2018   1:34 PM EDT To: Lequita Asal, MD

## 2018-08-27 NOTE — Telephone Encounter (Signed)
Spoke with the patient to inform her that her B-12 levels were low and she need to start oral B-12 1,000 mcg daily until 1 month to recheck her labs. The patient was agreeable to start oral B-112 and have labs recheck in 1 month. The patient was understanding.

## 2018-08-29 ENCOUNTER — Other Ambulatory Visit: Payer: Self-pay

## 2018-08-29 ENCOUNTER — Encounter: Payer: Self-pay | Admitting: Hematology and Oncology

## 2018-08-30 ENCOUNTER — Other Ambulatory Visit: Payer: Self-pay

## 2018-08-30 ENCOUNTER — Inpatient Hospital Stay (HOSPITAL_BASED_OUTPATIENT_CLINIC_OR_DEPARTMENT_OTHER): Payer: Medicare Other | Admitting: Nurse Practitioner

## 2018-08-30 ENCOUNTER — Encounter: Payer: Self-pay | Admitting: Nurse Practitioner

## 2018-08-30 DIAGNOSIS — D693 Immune thrombocytopenic purpura: Secondary | ICD-10-CM | POA: Diagnosis not present

## 2018-08-30 NOTE — Patient Instructions (Signed)
Idiopathic Thrombocytopenic Purpura Idiopathic thrombocytopenic purpura (ITP) is a disease in which the body's disease-fighting system (immune system) attacks platelets in the body. Platelets are blood cells that clump together to form clots. Blood clots help stop bleeding in the body. A person with ITP has too few platelets. As a result, it is harder for the blood to clot. A person may bruise and bleed easily, such as bleeding a lot from minor cuts and scrapes. ITP can affect both children and adults. It is usually a short-term (acute) condition in children and a long-term (chronic) condition in adults. What are the causes? The cause of ITP is not known. In some cases, this condition may develop:  After a viral infection.  During pregnancy.  After developing an immune system disorder. What increases the risk? You may be more likely to develop this condition if you:  Are female.  Are 4-55 years old. What are the signs or symptoms? If you have a mild case, you may not have any symptoms. In more serious cases, symptoms may include:  Bruising easily.  Minor injuries, like cuts and scrapes, that bleed for a long time.  Small red or purple dots under your skin (petechiae), especially on your shins.  Blood in the urine or stool (feces).  Nosebleeds.  Bleeding gums.  Heavy menstrual periods in women. How is this diagnosed? This condition may be diagnosed based on:  Your symptoms and medical history.  A physical exam.  Blood tests.  Tests of the spongy tissue inside your bones (bone marrow). How is this treated? Treatment depends on how severe your condition is. Treatment may include:  Monitoring your symptoms and your platelet count over time. You may need to see your health care provider for blood tests on a regular basis.  Receiving donated blood products (transfusions), such as platelets.  Medicines to: ? Reduce inflammation (steroids). ? Increase how many platelets  your body makes. ? Reduce the activity of your immune system.  Surgery to remove your spleen, if other treatments are not effective. The spleen is an organ in your upper left abdomen. It stores blood cells and is involved in some immune system functions. In ITP, the spleen releases proteins (antibodies) that mistakenly attack platelets. Follow these instructions at home: Medicines   Take over-the-counter and prescription medicines only as told by your health care provider. Do not take the following unless your health care provider approves: ? Over-the-counter medicines that contain aspirin. ? NSAIDs such as ibuprofen and naproxen.  Talk with your health care provider before you take any new medicines. Certain medicines may increase your risk for dangerous bleeding. Preventing falls   Follow instructions from your health care provider about ways that you can help prevent falls and injuries at home. These may include: ? Removing loose rugs, cords, and other tripping hazards from walkways. ? Installing grab bars in bathrooms. ? Using night-lights. General instructions  Tell all your health care providers, including your dentist, that you have a bleeding disorder. Make sure to tell providers before you have any procedure done, including dental cleanings.  Do not play contact sports or do activities that have a high risk for injury or bruising. Ask your health care provider what activities are safe for you.  Brush your teeth using a soft toothbrush.  When shaving, use an electric razor instead of a blade.  Wear a medical alert bracelet that says that you have a bleeding disorder. This can help you get the treatment you need  in case of emergency.  Keep all follow-up visits as told by your health care provider. This is important. You may need regular blood tests. Contact a health care provider if you have:  New symptoms.  Symptoms that get worse.  A fever. Get help right away if you  have:  A sudden, severe headache.  Sudden, severe nausea.  Severe bleeding.  Vomiting. Summary  Idiopathic thrombocytopenic purpura (ITP) is a disease in which the body's disease-fighting system (immune system) attacks blood cells that form clots to help stop bleeding in the body (platelets).  ITP can lead to bruising and bleeding easily, including frequent nosebleeds, blood in the urine or stool, and bleeding gums. In women, ITP can lead to heavy menstrual periods.  Treatment depends on how severe your condition is. It may include steroid therapy and medicines that reduce the activity of the immune system. In some cases, surgery is needed to remove the spleen.  Follow your health care provider's instructions about taking medicines, preventing falls, restricting some activities, and when to get help. This information is not intended to replace advice given to you by your health care provider. Make sure you discuss any questions you have with your health care provider. Document Released: 07/24/2013 Document Revised: 02/19/2018 Document Reviewed: 01/07/2017 Elsevier Patient Education  2020 [employer].

## 2018-08-30 NOTE — Progress Notes (Signed)
[facility]  [address], [address] [place], [place] [address] Phone: [phone]  Fax: [phone]   Clinic Day:  08/30/2018  Referring physician: Ellamae Sia, MD  Chief Complaint: Nancy Love is a 68 y.o. female with a history of idiopathic thrombocytopenic purpura (ITP) who returns to clinic for 1 week follow-up.     HPI: The patient was diagnosed with idiopathic thrombocytopenic purpura (ITP) in 2011 from an incidental finding of low platelets at [facility].   The patient was initially seen by Dr. Grayland Ormond on 06/28/2015.    Work up on 06/19/2015 revealed a hematocrit 43.8, hemoglobin 14.4, MCV 93.1, platelets 30,000, WBC 9,500.  Ferritin was 52 with an iron saturation 19% and a TBIC 364. Folate was 9.4. Vitamin B-12 was 231 (low). LDH 168. Haptoglobin was 92. PTT 29.  ANA was positive (ribonucleic protein 1.2).  M-spike was 0. PT/INR was 13.7/1.03 on 07/21/2015.  Bone marrow biopsy on 07/21/2015 revealed no morphologic evidence of hematopoietic neoplasia.  Marrow was normocellular to slightly hypercellular for age (40-50%) with relative myeloid hyperplasia, mild nonspecific dyserythropoiesis and mildly atypical megakaryocytic hyperplasia.  There was patchy mild to focally moderate increase in reticulin.  Storage iron was present.  Flow cytometry revealed no abnormality.  Cytogenetics were normal (24, XX).  Alterations with were felt to correlate with an immune mediated thrombocytopenia with secondary/compensatory megakaryocytic hyperplasia and treatment effect.  She initially received prednisone (unknown dose).  Platelet count did not improve.  She received Rituxan 08/24/2015 - 09/18/2015.  Pre treatment platelet count was 78,000, post-treatment platelet count was 42,000, and platelet count 1 month later was 40,000.  She was last seen by Dr. Mike Gip on 08/23/2018.  At that time, she was feeling fine.  She has chronic arthritis in her hands, back, shoulders, and  neck.  She has never required a transfusion.  She has easy bruising.  Her B12 level was low and she was started on oral supplementation  Nplate was previously denied by insurance unless platelet count less than 20,000.  Last mammogram was 05/24/2017.  She has not had her mammogram this year.  She participates in the low-dose CT lung cancer screening program.  Last screening on 03/23/2018-lung RADS 1, negative with recommendation to repeat in 2021.  In the interim, she underwent ultrasound of the abdomen on 08/24/2018 which showed no evidence of hepato-megaly or splenomegaly.  Large benign-appearing cyst in the left kidney.  Today, she says that she feels fine and denies specific complaints.  She has not yet rescheduled her colonoscopy which is due with Dr. Aleina Burgio Norris.  She says she feels fine today and denies specific complaints.    Past Medical History:  Diagnosis Date   Allergy    COPD (chronic obstructive pulmonary disease) ([place])    Depression    Hypertension    no longer needs meds   Thrombocytopenia ([place])     Past Surgical History:  Procedure Laterality Date   CARPAL TUNNEL RELEASE     COLONOSCOPY WITH PROPOFOL N/A 09/19/2017   Procedure: COLONOSCOPY WITH PROPOFOL WITH BIOPSIES;  Surgeon: Virgel Manifold, MD;  Location: [place];  Service: Endoscopy;  Laterality: N/A;  Needs later appt   POLYPECTOMY N/A 09/19/2017   Procedure: POLYPECTOMY;  Surgeon: Virgel Manifold, MD;  Location: [place];  Service: Endoscopy;  Laterality: N/A;   TONSILLECTOMY Bilateral 1968    Family History  Problem Relation Age of Onset   Breast cancer Mother  57's   Breast cancer Daughter        39's    Clotting disorder Daughter   She has a family history of breast cancer.  Her mother had breast cancer in her 43s; her daughter had breast cancer in her 27s.  Her daughter also has a history of pulmonary embolism.   Social History:  reports that she quit smoking  about 4 years ago. Her smoking use included cigarettes. She has a 43.00 pack-year smoking history. She has never used smokeless tobacco. She reports current alcohol use. She reports that she does not use drugs.She denies any exposure to radiation and toxins.  She initially smoked 2 packs/day, last several years smoked a pack/day, then 1 pack every 3 days. She has quit smoking  1.5-2 years ago. She drinks alcohol socially.  She lives in [place].  The patient is alone today.  Allergies: No Known Allergies  Current Medications: Current Outpatient Medications  Medication Sig Dispense Refill   albuterol (PROAIR HFA) 108 (90 Base) MCG/ACT inhaler Inhale 1 puff into the lungs every 4 (four) hours as needed.     ARIPiprazole (ABILIFY) 5 MG tablet 5 mg daily.   1   cetirizine (ZYRTEC) 10 MG tablet Take 10 mg by mouth daily.     FLUoxetine (PROZAC) 40 MG capsule Take 80 mg by mouth daily.   2   fluticasone furoate-vilanterol (BREO ELLIPTA) 200-25 MCG/INH AEPB Inhale 1 puff into the lungs daily. 1 each 5   gabapentin (NEURONTIN) 400 MG capsule Take 400 mg by mouth 2 (two) times daily.   2   nitroGLYCERIN (NITROSTAT) 0.4 MG SL tablet Place 1 tablet under the tongue every 5 (five) minutes as needed.     traZODone (DESYREL) 50 MG tablet Take 50 mg by mouth at bedtime. Take 3 tablets     No current facility-administered medications for this visit.     Review of Systems  Constitutional: Negative for chills, fever, malaise/fatigue and weight loss.       Feels "fine". Wearing mask  HENT: Negative for congestion, ear discharge, ear pain, hearing loss, nosebleeds, sinus pain, sore throat and tinnitus.   Eyes: Negative for blurred vision and double vision.  Respiratory: Negative for cough, hemoptysis, sputum production, shortness of breath and stridor.        COPD- history of  Cardiovascular: Negative for chest pain, palpitations and leg swelling.  Gastrointestinal: Negative for abdominal pain, blood in  stool, constipation, diarrhea, melena, nausea and vomiting.       Colonoscopy due; not currently scheduled  Genitourinary: Negative for dysuria, frequency, hematuria and urgency.       Hx of hemorrhoids  Musculoskeletal: Positive for joint pain (arthritis- chronic; controlled). Negative for back pain, myalgias and neck pain.  Skin: Negative for itching and rash.  Neurological: Negative for dizziness, tingling, sensory change, focal weakness and headaches.  Endo/Heme/Allergies: Bruises/bleeds easily.  Psychiatric/Behavioral: Positive for depression (improved). Negative for memory loss. The patient is not nervous/anxious and does not have insomnia.   All other systems reviewed and are negative.  Performance status (ECOG): 0  Vitals There were no vitals taken for this visit.  Physical Exam  Constitutional: She is oriented to person, place, and time. She appears well-developed and well-nourished. No distress.  Thin built  HENT:  Head: Normocephalic and atraumatic.  Mouth/Throat: Oropharynx is clear and moist. No oropharyngeal exudate.  Long hair  Eyes: Pupils are equal, round, and reactive to light. Conjunctivae and EOM are normal. No scleral  icterus.  Neck: Normal range of motion. Neck supple. No JVD present.  Cardiovascular: Normal rate, regular rhythm and normal heart sounds. Exam reveals no gallop and no friction rub.  No murmur heard. Pulmonary/Chest: Effort normal and breath sounds normal. No respiratory distress. She has no wheezes. She has no rales. She exhibits no tenderness.  Abdominal: Soft. Bowel sounds are normal. She exhibits no distension and no mass. There is no hepatosplenomegaly. There is no abdominal tenderness. There is no rebound and no guarding.  Musculoskeletal: Normal range of motion.        General: No tenderness or edema.     Comments: Clubbing.  Lymphadenopathy:    She has no cervical adenopathy.    She has no axillary adenopathy.       Right: No  supraclavicular adenopathy present.       Left: No supraclavicular adenopathy present.  Neurological: She is alert and oriented to person, place, and time.  Right sided tremor- not seen today  Skin: Skin is warm and dry. No rash noted. She is not diaphoretic. No erythema. No pallor.  Spider angioma.  No petechiae.  Psychiatric: She has a normal mood and affect. Her behavior is normal. Judgment and thought content normal.  Nursing note and vitals reviewed.   No visits with results within 3 Day(s) from this visit.  Latest known visit with results is:  Appointment on 08/23/2018  Component Date Value Ref Range Status   TSH 08/23/2018 1.717  0.350 - 4.500 uIU/mL Final   Comment: Performed by a 3rd Generation assay with a functional sensitivity of <=0.01 uIU/mL. Performed at [facility], [address] [place], [place] [address]    HCV Ab 08/23/2018 0.1  0.0 - 0.9 s/co ratio Final   Comment: (NOTE)                                  Negative:     < 0.8                             Indeterminate: 0.8 - 0.9                                  Positive:     > 0.9 The CDC recommends that a positive HCV antibody result be followed up with a HCV Nucleic Acid Amplification test (270350). Performed At: [facility] [place], [place] [id number] Rush Farmer MD BZ:[id number]    Hepatitis B Surface Ag 08/23/2018 Negative  Negative Final   Comment: (NOTE) Performed At: [facility] [place], [place] [id number] Rush Farmer MD NI:[id number]    Hep B Core Total Ab 08/23/2018 Negative  Negative Final   Comment: (NOTE) Performed At: [facility] [place], [place] [id number] Rush Farmer MD QQ:[id number]    Folate 08/23/2018 9.1  >5.9 ng/mL Final   Performed at [facility], [address] [place], [place] [address]   Vitamin B-12 08/23/2018 302  180 - 914 pg/mL Final   Comment: (NOTE) This assay is  not validated for testing neonatal or myeloproliferative syndrome specimens for Vitamin B12 levels. Performed at [facility], [place] [address] [place], [place] [address]    Sodium 08/23/2018 139  135 - 145 mmol/L Final   Potassium  08/23/2018 3.5  3.5 - 5.1 mmol/L Final   Chloride 08/23/2018 106  98 - 111 mmol/L Final   CO2 08/23/2018 27  22 - 32 mmol/L Final   Glucose, Bld 08/23/2018 107* 70 - 99 mg/dL Final   BUN 08/23/2018 11  8 - 23 mg/dL Final   Creatinine, Ser 08/23/2018 0.71  0.44 - 1.00 mg/dL Final   Calcium 08/23/2018 9.5  8.9 - 10.3 mg/dL Final   Total Protein 08/23/2018 7.1  6.5 - 8.1 g/dL Final   Albumin 08/23/2018 3.9  3.5 - 5.0 g/dL Final   AST 08/23/2018 24  15 - 41 U/L Final   ALT 08/23/2018 18  0 - 44 U/L Final   Alkaline Phosphatase 08/23/2018 80  38 - 126 U/L Final   Total Bilirubin 08/23/2018 0.6  0.3 - 1.2 mg/dL Final   GFR calc non Af Amer 08/23/2018 >60  >60 mL/min Final   GFR calc Af Amer 08/23/2018 >60  >60 mL/min Final   Anion gap 08/23/2018 6  5 - 15 Final   Performed at [facility], [address], [place], [place] [address]   WBC 08/23/2018 9.6  4.0 - 10.5 K/uL Final   RBC 08/23/2018 4.66  3.87 - 5.11 MIL/uL Final   Hemoglobin 08/23/2018 13.9  12.0 - 15.0 g/dL Final   HCT 08/23/2018 42.7  36.0 - 46.0 % Final   MCV 08/23/2018 91.6  80.0 - 100.0 fL Final   MCH 08/23/2018 29.8  26.0 - 34.0 pg Final   MCHC 08/23/2018 32.6  30.0 - 36.0 g/dL Final   RDW 08/23/2018 13.2  11.5 - 15.5 % Final   Platelets 08/23/2018 39* 150 - 400 K/uL Final   Comment: Immature Platelet Fraction may be clinically indicated, consider ordering this additional test [id number] Platelet count consistent with citrate. EDTA CLUMPING NOT OBSERVED    nRBC 08/23/2018 0.0  0.0 - 0.2 % Final   Neutrophils Relative % 08/23/2018 74  % Final   Neutro Abs 08/23/2018 7.1  1.7 - 7.7 K/uL Final   Lymphocytes Relative 08/23/2018 14  % Final    Lymphs Abs 08/23/2018 1.4  0.7 - 4.0 K/uL Final   Monocytes Relative 08/23/2018 6  % Final   Monocytes Absolute 08/23/2018 0.6  0.1 - 1.0 K/uL Final   Eosinophils Relative 08/23/2018 4  % Final   Eosinophils Absolute 08/23/2018 0.4  0.0 - 0.5 K/uL Final   Basophils Relative 08/23/2018 1  % Final   Basophils Absolute 08/23/2018 0.1  0.0 - 0.1 K/uL Final   Immature Granulocytes 08/23/2018 1  % Final   Abs Immature Granulocytes 08/23/2018 0.07  0.00 - 0.07 K/uL Final   Performed at [facility], [address], [place], [place] [address]   HIV Screen 4th Generation wRfx 08/23/2018 Non Reactive  Non Reactive Final   Comment: (NOTE) Performed At: [facility] [place], [place] [id number] Rush Farmer MD BD:[id number]     Assessment:  Nancy Love is a 68 y.o. 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  She was previously unresponsive to steroids and Rituxan.  Work up on 06/19/2015:  hematocrit 43.8, hemoglobin 14.4, MCV 93.1, platelets 30,000, WBC 9,500.  Ferritin was 52 with an iron saturation 19% and a TBIC 364. Folate was 9.4. Vitamin B-12 was 231 (low). LDH 168. Haptoglobin was 92. PTT 29.  ANA was positive (ribonucleic protein 1.2). M-spike was 0.  Bone marrow biopsy on 07/21/2015 revealed no morphologic evidence  of hematopoietic neoplasia.  Marrow was normocellular to slightly hypercellular for age (40-50%) with relative myeloid hyperplasia, mild nonspecific dyserythropoiesis and mildly atypical megakaryocytic hyperplasia.  There was patchy mild to focally moderate increase in reticulin.  Storage iron was present.  Flow cytometry revealed no abnormality.  Cytogenetics were normal (39, XX).  Alterations with were felt to correlate with an immune mediated thrombocytopenia with secondary/compensatory megakaryocytic hyperplasia and treatment effect.  She received Rituxan 08/24/2015 - 09/18/2015.  She was unresponsive with a  platelet count of 78,000 pre-treatment, 42,000 post-treatment and 40,000 1 month later.    She has COPD.  Low dose chest CT on 03/23/2018 revealed lung-RADS 1, negative.  She has a family history of breast cancer (mother 61s and daughter 1s).  Bilateral mammogram on 05/24/2017 revealed no evidence of malignancy.   Symptomatically she feels fine and denies any bruising or bleeding.  She has not yet had her annual mammogram or follow-up colonoscopy  Plan: 1.  No labs today.  We discussed results from labs drawn on 08/23/2018. 2.  Immune mediated thrombocytopenic purpura (ITP). -We again reviewed medical history, diagnosis including prior bone marrow, and prior management of ITP including prior poor response to steroids and Rituxan.  I also discussed her history with Dr. Grayland Ormond who previously treated her. -Interval ultrasound of abdomen did not show hepatomegaly or splenomegaly. - Labs from last week discussed in detail including CBC with differential with platelet count of 39,000, B12 low at 302 (goal of greater than 400- see below), folate 9.1 (normal), TSH 1.717 (normal), hepatitis B antibody and antigen were negative, hepatitis C antibody was negative, HIV testing was nonreactive. -Nplate was denied by insurance unless platelet count was less than 20,000 -Could consider repeat bone marrow treatments needed 2.  Renal cyst  - Ultrasound on 08/24/2018 showed cyst of left kidney measuring 7.6 cm.  Small 43m cyst on right kidney   - per radiology, benign appearing  - could consider reimaging in 6-12 months to re-evaluate stability   - asymptomatic  - continue to monitor  3.  B12 deficiency  -She has a history of low B12 (231) in 2017  -Repeat B12 on 08/23/2018 was 302; low  -She is started on B12 orally with goal B12 greater than 400  -We will plan to recheck B12 level at next visit 4.  Family history of breast cancer  -Family history significant for breast cancer  -Encouraged screening  guidelines and annual mammogram  -Last mammogram was 05/2017  - Overdue.  Encouraged to schedule with PCP 5. Colonoscopy- hx of colon polyps  -She had abnormal colonoscopy in 2019.  Recommendation was to  follow-up and repeat colonoscopy in 3 months.  There was concern for  polyp in her ascending colon which was not removed due to poor  visualization. Dr. TBonna Gainsrequested that repeat colonoscopy be  performed by Dr. WAllen Norrisand patient agreed. Procedure was scheduled for  08/02/2018 but cancelled and has not yet been rescheduled.   - encouraged patient to call GI clinic to reschedule.  6. Low Dose CT Lung Cancer Screening  - last on 03/23/2018  - recommend repeating 03/2019 per guidelines Disposition: RTC in 1 month for labs (CBC, CMP, B12) and reevaluation with Dr. CMike Gip I discussed the assessment and treatment plan with the patient.  The patient was provided an opportunity to ask questions and all were answered.  The patient agreed with the plan and demonstrated an understanding of the instructions.  The patient was advised  to call back if the symptoms worsen or if the condition fails to improve as anticipated.  Beckey Rutter, DNP, AGNP-C [place] [facility] [phone] (work cell) [phone] (office)

## 2018-08-30 NOTE — Progress Notes (Signed)
Patient denies any concerns today.  

## 2018-09-12 ENCOUNTER — Other Ambulatory Visit: Payer: Self-pay

## 2018-09-12 DIAGNOSIS — Z86010 Personal history of colonic polyps: Secondary | ICD-10-CM

## 2018-09-12 MED ORDER — SUPREP BOWEL PREP KIT 17.5-3.13-1.6 GM/177ML PO SOLN: 1.0000 | ORAL | 0 refills | Status: AC

## 2018-09-13 ENCOUNTER — Other Ambulatory Visit: Payer: Self-pay

## 2018-09-13 ENCOUNTER — Telehealth: Payer: Self-pay

## 2018-09-13 NOTE — Telephone Encounter (Signed)
Confirmed with patient colonoscopy date.  She said the date should be 09/28/18.  Informed her that we will send out a new instruction packet to reflect 09/28/18 date instead of 09/27/18.  Thanks,  Sharyn Lull

## 2018-09-25 ENCOUNTER — Other Ambulatory Visit: Payer: Self-pay

## 2018-09-25 ENCOUNTER — Other Ambulatory Visit
Admission: RE | Admit: 2018-09-25 | Discharge: 2018-09-25 | Disposition: A | Discharge status: Home / Self Care | Payer: Medicare Other | Source: Ambulatory Visit | Attending: Gastroenterology | Admitting: Gastroenterology

## 2018-09-25 DIAGNOSIS — Z01812 Encounter for preprocedural laboratory examination: Secondary | ICD-10-CM | POA: Diagnosis present

## 2018-09-25 DIAGNOSIS — Z20828 Contact with and (suspected) exposure to other viral communicable diseases: Secondary | ICD-10-CM | POA: Insufficient documentation

## 2018-09-25 DIAGNOSIS — Z86010 Personal history of colonic polyps: Secondary | ICD-10-CM

## 2018-09-25 NOTE — Anesthesia Preprocedure Evaluation (Addendum)
Anesthesia Evaluation  Patient identified by MRN, date of birth, ID band Patient awake    History of Anesthesia Complications Negative for: history of anesthetic complications  Airway Mallampati: II  TM Distance: >3 FB Neck ROM: Full    Dental  (+) Poor Dentition   Pulmonary COPD,  COPD inhaler, former smoker,   PFT (06/2018): This pulmonary function study is consistent with severe obstructive lung disease.  No response to bronchodilators however clinical improvement may occur in the absence of spirometric improvement.  DLCO is moderately decreased clinical correlation is recommended.    + wheezing      Cardiovascular Exercise Tolerance: Good negative cardio ROS Normal cardiovascular exam     Neuro/Psych Depression negative neurological ROS     GI/Hepatic negative GI ROS, Neg liver ROS,   Endo/Other  negative endocrine ROS  Renal/GU negative Renal ROS     Musculoskeletal   Abdominal   Peds  Hematology  (+) Blood dyscrasia (Idiopathic thrombocytopenic purpura), ,   Anesthesia Other Findings Colon cancer  Reproductive/Obstetrics                            Anesthesia Physical Anesthesia Plan  ASA: III  Anesthesia Plan: General   Post-op Pain Management:    Induction: Intravenous  PONV Risk Score and Plan: 3 and Propofol infusion and TIVA  Airway Management Planned: Natural Airway and Nasal Cannula  Additional Equipment:   Intra-op Plan:   Post-operative Plan:   Informed Consent: I have reviewed the patients History and Physical, chart, labs and discussed the procedure including the risks, benefits and alternatives for the proposed anesthesia with the patient or authorized representative who has indicated his/her understanding and acceptance.       Plan Discussed with: CRNA and Anesthesiologist  Anesthesia Plan Comments: (Will give DuoNeb treatment prior to procedure for  current wheezing)       Anesthesia Quick Evaluation

## 2018-09-26 ENCOUNTER — Other Ambulatory Visit: Payer: Self-pay

## 2018-09-26 LAB — SARS CORONAVIRUS 2 (TAT 6-24 HRS): SARS Coronavirus 2: NEGATIVE

## 2018-09-27 ENCOUNTER — Inpatient Hospital Stay: Payer: Medicare Other | Admitting: Hematology and Oncology

## 2018-09-27 ENCOUNTER — Inpatient Hospital Stay: Payer: Medicare Other

## 2018-09-28 ENCOUNTER — Ambulatory Visit: Payer: Medicare Other | Admitting: Anesthesiology

## 2018-09-28 ENCOUNTER — Encounter
Admission: RE | Disposition: A | Discharge status: Home / Self Care | Payer: Self-pay | Source: Home / Self Care | Attending: Gastroenterology

## 2018-09-28 ENCOUNTER — Ambulatory Visit
Admission: RE | Admit: 2018-09-28 | Discharge: 2018-09-28 | Disposition: A | Discharge status: Home / Self Care | Payer: Medicare Other | Attending: Gastroenterology | Admitting: Gastroenterology

## 2018-09-28 ENCOUNTER — Other Ambulatory Visit: Payer: Self-pay

## 2018-09-28 DIAGNOSIS — K635 Polyp of colon: Secondary | ICD-10-CM

## 2018-09-28 DIAGNOSIS — Z860100 Personal history of colon polyps, unspecified: Secondary | ICD-10-CM

## 2018-09-28 DIAGNOSIS — F329 Major depressive disorder, single episode, unspecified: Secondary | ICD-10-CM | POA: Diagnosis not present

## 2018-09-28 DIAGNOSIS — D122 Benign neoplasm of ascending colon: Secondary | ICD-10-CM | POA: Insufficient documentation

## 2018-09-28 DIAGNOSIS — Z79899 Other long term (current) drug therapy: Secondary | ICD-10-CM | POA: Insufficient documentation

## 2018-09-28 DIAGNOSIS — Z87891 Personal history of nicotine dependence: Secondary | ICD-10-CM | POA: Insufficient documentation

## 2018-09-28 DIAGNOSIS — D696 Thrombocytopenia, unspecified: Secondary | ICD-10-CM | POA: Insufficient documentation

## 2018-09-28 DIAGNOSIS — I10 Essential (primary) hypertension: Secondary | ICD-10-CM | POA: Diagnosis not present

## 2018-09-28 DIAGNOSIS — Z1211 Encounter for screening for malignant neoplasm of colon: Secondary | ICD-10-CM | POA: Insufficient documentation

## 2018-09-28 DIAGNOSIS — Z7951 Long term (current) use of inhaled steroids: Secondary | ICD-10-CM | POA: Diagnosis not present

## 2018-09-28 DIAGNOSIS — Z832 Family history of diseases of the blood and blood-forming organs and certain disorders involving the immune mechanism: Secondary | ICD-10-CM | POA: Insufficient documentation

## 2018-09-28 DIAGNOSIS — J449 Chronic obstructive pulmonary disease, unspecified: Secondary | ICD-10-CM | POA: Insufficient documentation

## 2018-09-28 DIAGNOSIS — Z803 Family history of malignant neoplasm of breast: Secondary | ICD-10-CM | POA: Insufficient documentation

## 2018-09-28 HISTORY — PX: POLYPECTOMY: SHX5525

## 2018-09-28 HISTORY — PX: COLONOSCOPY WITH PROPOFOL: SHX5780

## 2018-09-28 SURGERY — COLONOSCOPY WITH PROPOFOL
Anesthesia: Choice

## 2018-09-28 SURGERY — COLONOSCOPY WITH PROPOFOL
Anesthesia: General | Site: Rectum

## 2018-09-28 MED ORDER — PROPOFOL 10 MG/ML IV BOLUS
INTRAVENOUS | Status: DC | PRN
  Administered 2018-09-28 (×6): 10 mg via INTRAVENOUS
  Administered 2018-09-28: 30 mg via INTRAVENOUS
  Administered 2018-09-28: 20 mg via INTRAVENOUS
  Administered 2018-09-28: 10 mg via INTRAVENOUS
  Administered 2018-09-28: 30 mg via INTRAVENOUS
  Administered 2018-09-28: 80 mg via INTRAVENOUS
  Administered 2018-09-28: 20 mg via INTRAVENOUS
  Administered 2018-09-28 (×8): 10 mg via INTRAVENOUS
  Administered 2018-09-28: 30 mg via INTRAVENOUS

## 2018-09-28 MED ORDER — SODIUM CHLORIDE 0.9 % IV SOLN: INTRAVENOUS | Status: DC

## 2018-09-28 MED ORDER — ALBUTEROL SULFATE (2.5 MG/3ML) 0.083% IN NEBU
2.5000 mg | INHALATION_SOLUTION | RESPIRATORY_TRACT | Status: AC
  Administered 2018-09-28: 13:00:00 2.5 mg via RESPIRATORY_TRACT

## 2018-09-28 MED ORDER — LIDOCAINE HCL (CARDIAC) PF 100 MG/5ML IV SOSY
PREFILLED_SYRINGE | INTRAVENOUS | Status: DC | PRN
  Administered 2018-09-28: 30 mg via INTRAVENOUS

## 2018-09-28 MED ORDER — LACTATED RINGERS IV SOLN
100.0000 mL/h | INTRAVENOUS | Status: DC
  Administered 2018-09-28: 100 mL/h via INTRAVENOUS

## 2018-09-28 MED ORDER — STERILE WATER FOR IRRIGATION IR SOLN
Status: DC | PRN
  Administered 2018-09-28: .05 mL

## 2018-09-28 MED ORDER — ONDANSETRON HCL 4 MG/2ML IJ SOLN: 4.0000 mg | Freq: Once | INTRAMUSCULAR | Status: DC | PRN

## 2018-09-28 MED ORDER — ACETAMINOPHEN 10 MG/ML IV SOLN: 1000.0000 mg | Freq: Once | INTRAVENOUS | Status: DC | PRN

## 2018-09-28 MED ORDER — IPRATROPIUM-ALBUTEROL 0.5-2.5 (3) MG/3ML IN SOLN: 3.0000 mL | Freq: Four times a day (QID) | RESPIRATORY_TRACT | Status: DC

## 2018-09-28 SURGICAL SUPPLY — 11 items
CANISTER SUCT 1200ML W/VALVE (MISCELLANEOUS) ×4 IMPLANT
ELECT REM PT RETURN 9FT ADLT (ELECTROSURGICAL) ×4
ELECTRODE REM PT RTRN 9FT ADLT (ELECTROSURGICAL) ×2 IMPLANT
FORCEPS BIOP RAD 4 LRG CAP 4 (CUTTING FORCEPS) ×4 IMPLANT
GOWN CVR UNV OPN BCK APRN NK (MISCELLANEOUS) ×4 IMPLANT
GOWN ISOL THUMB LOOP REG UNIV (MISCELLANEOUS) ×4
KIT ENDO PROCEDURE OLY (KITS) ×4 IMPLANT
SNARE SHORT THROW 13M SML OVAL (MISCELLANEOUS) ×4 IMPLANT
SNARE SPIRAL (MISCELLANEOUS) ×4 IMPLANT
TRAP ETRAP POLY (MISCELLANEOUS) ×4 IMPLANT
WATER STERILE IRR 250ML POUR (IV SOLUTION) ×4 IMPLANT

## 2018-09-28 NOTE — Op Note (Signed)
[facility] Patient Name: Nancy Love Procedure Date: 09/28/2018 1:28 PM MRN: [id number] Account #: [id number] Date of Birth: 13-Mar-1950 Admit Type: Outpatient Age: 68 Room: [facility] OR ROOM 01 Gender: Female Note Status: Finalized Procedure:            Colonoscopy Indications:          High risk colon cancer surveillance: Personal history                        of colonic polyps Providers:            Lucilla Lame MD, MD Referring MD:         Remus Blake MD, MD (Referring MD) Medicines:            Propofol per Anesthesia Complications:        No immediate complications. Procedure:            Pre-Anesthesia Assessment:                       - Prior to the procedure, a History and Physical was                        performed, and patient medications and allergies were                        reviewed. The patient's tolerance of previous                        anesthesia was also reviewed. The risks and benefits of                        the procedure and the sedation options and risks were                        discussed with the patient. All questions were                        answered, and informed consent was obtained. Prior                        Anticoagulants: The patient has taken no previous                        anticoagulant or antiplatelet agents. ASA Grade                        Assessment: III - A patient with severe systemic                        disease. After reviewing the risks and benefits, the                        patient was deemed in satisfactory condition to undergo                        the procedure.                       After obtaining informed consent, the colonoscope was  passed under direct vision. Throughout the procedure,                        the patient's blood pressure, pulse, and oxygen                        saturations were monitored continuously. The was   introduced through the anus and advanced to the the                        cecum, identified by appendiceal orifice and ileocecal                        valve. The colonoscopy was performed without                        difficulty. The patient tolerated the procedure well.                        The quality of the bowel preparation was good. Findings:      The perianal and digital rectal examinations were normal.      Multiple sessile polyps were found in the entire colon. The polyps were       2 to 10 mm in size. These polyps were removed with a hot snare.       Resection and retrieval were complete.      Many sessile polyps were found in the entire colon. The polyps were 4 to       7 mm in size. These polyps were removed with a cold snare. Resection and       retrieval were complete.      Many sessile polyps were found in the entire colon. The polyps were 1 to       4 mm in size. These polyps were removed with a cold biopsy forceps.       Resection and retrieval were complete.      Many polyps were treated with cautery vaporize them. Impression:           - Multiple 2 to 10 mm polyps in the entire colon,                        removed with a hot snare. Resected and retrieved.                       - Many 4 to 7 mm polyps in the entire colon, removed                        with a cold snare. Resected and retrieved.                       - Many 1 to 4 mm polyps in the entire colon, removed                        with a cold biopsy forceps. Resected and retrieved.                       - Many polyps were treated with cautery vaporize them. Recommendation:       - Discharge patient to home.                       -  Resume previous diet.                       - Continue present medications.                       - Await pathology results.                       - Repeat colonoscopy in 1 year for surveillance. Procedure Code(s):    --- Professional ---                       [phone],  Colonoscopy, flexible; with removal of tumor(s),                        polyp(s), or other lesion(s) by snare technique                       45380, 27, Colonoscopy, flexible; with biopsy, single                        or multiple Diagnosis Code(s):    --- Professional ---                       Z86.010, Personal history of colonic polyps                       K63.5, Polyp of colon CPT copyright 2019 American Medical Association. All rights reserved. The codes documented in this report are preliminary and upon coder review may  be revised to meet current compliance requirements. Lucilla Lame MD, MD 09/28/2018 2:34:16 PM This report has been signed electronically. Number of Addenda: 0 Note Initiated On: 09/28/2018 1:28 PM Scope Withdrawal Time: 0 hours 41 minutes 55 seconds  Total Procedure Duration: 0 hours 50 minutes 25 seconds  Estimated Blood Loss: Estimated blood loss: none.      [facility]

## 2018-09-28 NOTE — H&P (Signed)
Lucilla Lame, MD [address] [place], [place] [address] Phone:[phone] Fax : [phone]  Primary Care Physician:  Ellamae Sia, MD Primary Gastroenterologist:  Dr. Allen Norris  Pre-Procedure History & Physical: HPI:  Nancy Love is a 68 y.o. female is here for an colonoscopy.   Past Medical History:  Diagnosis Date  . Allergy   . COPD (chronic obstructive pulmonary disease) ([place])   . Depression   . Hypertension    no longer needs meds  . Thrombocytopenia ([place])     Past Surgical History:  Procedure Laterality Date  . CARPAL TUNNEL RELEASE    . COLONOSCOPY WITH PROPOFOL N/A 09/19/2017   Procedure: COLONOSCOPY WITH PROPOFOL WITH BIOPSIES;  Surgeon: Virgel Manifold, MD;  Location: [facility];  Service: Endoscopy;  Laterality: N/A;  Needs later appt  . POLYPECTOMY N/A 09/19/2017   Procedure: POLYPECTOMY;  Surgeon: Virgel Manifold, MD;  Location: [facility];  Service: Endoscopy;  Laterality: N/A;  . TONSILLECTOMY Bilateral 1968    Prior to Admission medications   Medication Sig Start Date End Date Taking? Authorizing Provider  albuterol (PROAIR HFA) 108 (90 Base) MCG/ACT inhaler Inhale 1 puff into the lungs every 4 (four) hours as needed. 05/27/11  Yes [provider]  ARIPiprazole (ABILIFY) 5 MG tablet 5 mg daily.  04/26/17  Yes [provider]  cetirizine (ZYRTEC) 10 MG tablet Take 10 mg by mouth daily.   Yes [provider]  FLUoxetine (PROZAC) 40 MG capsule Take 80 mg by mouth daily.  05/20/15  Yes [provider]  fluticasone furoate-vilanterol (BREO ELLIPTA) 200-25 MCG/INH AEPB Inhale 1 puff into the lungs daily. 06/14/18  Yes Allyne Gee, MD  gabapentin (NEURONTIN) 400 MG capsule Take 400 mg by mouth 2 (two) times daily.  03/16/15  Yes [provider]  traZODone (DESYREL) 50 MG tablet Take 50 mg by mouth at bedtime. Take 3 tablets   Yes [provider]  vitamin B-12  (CYANOCOBALAMIN) 1000 MCG tablet Take 1,000 mcg by mouth daily.   Yes [provider]  Na Sulfate-K Sulfate-Mg Sulf (SUPREP BOWEL PREP KIT) 17.5-3.13-1.6 GM/177ML SOLN Take 1 kit by mouth as directed. 09/12/18   Lucilla Lame, MD  nitroGLYCERIN (NITROSTAT) 0.4 MG SL tablet Place 1 tablet under the tongue every 5 (five) minutes as needed. 11/13/07   [provider]    Allergies as of 09/12/2018  . (No Known Allergies)    Family History  Problem Relation Age of Onset  . Breast cancer Mother        56's  . Breast cancer Daughter        11's   . Clotting disorder Daughter     Social History   Socioeconomic History  . Marital status: Widowed    Spouse name: Not on file  . Number of children: Not on file  . Years of education: Not on file  . Highest education level: Not on file  Occupational History  . Not on file  Social Needs  . Financial resource strain: Not on file  . Food insecurity    Worry: Not on file    Inability: Not on file  . Transportation needs    Medical: Not on file    Non-medical: Not on file  Tobacco Use  . Smoking status: Former Smoker    Packs/day: 1.00    Years: 43.00    Pack years: 43.00    Types: Cigarettes    Quit  date: 04/19/2014    Years since quitting: 4.4  . Smokeless tobacco: Never Used  Substance and Sexual Activity  . Alcohol use: Yes    Comment: Holidays/ socially  . Drug use: No  . Sexual activity: Not on file  Lifestyle  . Physical activity    Days per week: Not on file    Minutes per session: Not on file  . Stress: Not on file  Relationships  . Social Herbalist on phone: Not on file    Gets together: Not on file    Attends religious service: Not on file    Active member of club or organization: Not on file    Attends meetings of clubs or organizations: Not on file    Relationship status: Not on file  . Intimate partner violence    Fear of current or ex partner: Not on file    Emotionally abused: Not  on file    Physically abused: Not on file    Forced sexual activity: Not on file  Other Topics Concern  . Not on file  Social History Narrative  . Not on file    Review of Systems: See HPI, otherwise negative ROS  Physical Exam: BP 117/86   Pulse 85   Temp 97.9 F (36.6 C) (Temporal)   Ht _0  (1.575 m)   Wt 50.8 kg   SpO2 96%   BMI 20.49 kg/m  General:   Alert,  pleasant and cooperative in NAD Head:  Normocephalic and atraumatic. Neck:  Supple; no masses or thyromegaly. Lungs:  Clear throughout to auscultation.    Heart:  Regular rate and rhythm. Abdomen:  Soft, nontender and nondistended. Normal bowel sounds, without guarding, and without rebound.   Neurologic:  Alert and  oriented x4;  grossly normal neurologically.  Impression/Plan: Nancy Love is here for an colonoscopy to be performed for adenomatous polyps 09/19/2017  Risks, benefits, limitations, and alternatives regarding  colonoscopy have been reviewed with the patient.  Questions have been answered.  All parties agreeable.   Lucilla Lame, MD  09/28/2018, 1:00 PM

## 2018-09-28 NOTE — Anesthesia Postprocedure Evaluation (Signed)
Anesthesia Post Note  Patient: Nancy Love  Procedure(s) Performed: COLONOSCOPY WITH PROPOFOL (N/A Rectum) POLYPECTOMY (Rectum)  Patient location during evaluation: PACU Anesthesia Type: General Level of consciousness: awake and alert Pain management: pain level controlled Vital Signs Assessment: post-procedure vital signs reviewed and stable Respiratory status: spontaneous breathing, nonlabored ventilation, respiratory function stable and patient connected to nasal cannula oxygen Cardiovascular status: blood pressure returned to baseline and stable Postop Assessment: no apparent nausea or vomiting Anesthetic complications: no    Adele Barthel Breelyn Icard

## 2018-09-28 NOTE — Transfer of Care (Signed)
Immediate Anesthesia Transfer of Care Note  Patient: Nancy Love  Procedure(s) Performed: COLONOSCOPY WITH PROPOFOL (N/A Rectum) POLYPECTOMY (Rectum)  Patient Location: PACU  Anesthesia Type: General  Level of Consciousness: awake, alert  and patient cooperative  Airway and Oxygen Therapy: Patient Spontanous Breathing and Patient connected to supplemental oxygen  Post-op Assessment: Post-op Vital signs reviewed, Patient's Cardiovascular Status Stable, Respiratory Function Stable, Patent Airway and No signs of Nausea or vomiting  Post-op Vital Signs: Reviewed and stable  Complications: No apparent anesthesia complications

## 2018-09-28 NOTE — Anesthesia Procedure Notes (Signed)
Date/Time: 09/28/2018 1:34 PM Performed by: Cameron Ali, CRNA Pre-anesthesia Checklist: Patient identified, Emergency Drugs available, Suction available, Timeout performed and Patient being monitored Patient Re-evaluated:Patient Re-evaluated prior to induction Oxygen Delivery Method: Nasal cannula Placement Confirmation: positive ETCO2

## 2018-10-01 ENCOUNTER — Encounter: Payer: Self-pay | Admitting: Gastroenterology

## 2018-10-02 NOTE — Progress Notes (Signed)
[facility]  [address] [place], [place] [address] Phone: [phone]  Fax: [phone]   Clinic Day:  10/04/2018  Referring physician: Ellamae Sia, MD  Chief Complaint: Nancy Love is a 68 y.o. female with a history of idiopathic thrombocytopenic purpura (ITP) who is seen for 1 month assessment.   HPI: The patient was last seen in the hematology clinic by Beckey Rutter, NP on 08/30/2018. At that time, she felt fine and denied any bruising or bleeding.  She had not yet had her annual mammogram or follow-up colonoscopy. Beckey Rutter, NP reviewed her work up and discussed direction of therapy. Her abdomen ultrasound on 08/24/2018 showed cyst of left kidney measuring 7.6 cm and a small 6 mm cyst on right kidney. Per radiology, benign appearing. Could consider re-imaging in 6-12 months to re-evaluate stability.   Abdominal ultrasound on 08/24/2018 revealed no hepatomegaly or splenomegaly. There was a large benign-appearing cyst of the LEFT kidney.   Colonoscopy on 09/28/2018 with Dr. Allen Norris showed multiple 2 to 10 mm polyps in the entire colon, removed with a hot snare. There were many 4 to 7 mm polyps in the entire colon, removed with a cold snare. There were many 1 to 4 mm polyps in the entire colon, removed with a cold biopsy forceps. There were many polyps treated with cautery.    During the interim, the patient felt "all right".  She denies any excess bruising or bleeding.  She has sputum production. She has joint pain. She denies hemoptysis. She notes edema in her legs. She denies any leg trauma. She is taking oral B-12. Her mood is good. She will have a follow-up colonoscopy in 1 year.    Past Medical History:  Diagnosis Date  . Allergy   . COPD (chronic obstructive pulmonary disease) ([place])   . Depression   . Hypertension    no longer needs meds  . Thrombocytopenia ([place])     Past Surgical History:  Procedure Laterality Date  . CARPAL  TUNNEL RELEASE    . COLONOSCOPY WITH PROPOFOL N/A 09/19/2017   Procedure: COLONOSCOPY WITH PROPOFOL WITH BIOPSIES;  Surgeon: Virgel Manifold, MD;  Location: [facility];  Service: Endoscopy;  Laterality: N/A;  Needs later appt  . COLONOSCOPY WITH PROPOFOL N/A 09/28/2018   Procedure: COLONOSCOPY WITH PROPOFOL;  Surgeon: Lucilla Lame, MD;  Location: [facility];  Service: Endoscopy;  Laterality: N/A;  . POLYPECTOMY N/A 09/19/2017   Procedure: POLYPECTOMY;  Surgeon: Virgel Manifold, MD;  Location: [facility];  Service: Endoscopy;  Laterality: N/A;  . POLYPECTOMY  09/28/2018   Procedure: POLYPECTOMY;  Surgeon: Lucilla Lame, MD;  Location: [facility];  Service: Endoscopy;;  . TONSILLECTOMY Bilateral 1968    Family History  Problem Relation Age of Onset  . Breast cancer Mother        72's  . Breast cancer Daughter        56's   . Clotting disorder Daughter     Social History:  reports that she quit smoking about 4 years ago. Her smoking use included cigarettes. She has a 43.00 pack-year smoking history. She has never used smokeless tobacco. She reports current alcohol use. She reports that she does not use drugs. She denies any exposure to radiation and toxins.  She initially smoked 2 packs/day, last several years smoked a pack/day, then 1 pack every 3 days. She has quit smoking  1.5-2 years ago. She drinks alcohol socially.  She lives in [place]. The patient is alone today.  Allergies: No Known Allergies  Current Medications: Current Outpatient Medications  Medication Sig Dispense Refill  . albuterol (PROAIR HFA) 108 (90 Base) MCG/ACT inhaler Inhale 1 puff into the lungs every 4 (four) hours as needed.    . ARIPiprazole (ABILIFY) 5 MG tablet 5 mg daily.   1  . cetirizine (ZYRTEC) 10 MG tablet Take 10 mg by mouth daily.    Marland Kitchen FLUoxetine (PROZAC) 40 MG capsule Take 80 mg by mouth daily.   2  . fluticasone furoate-vilanterol (BREO ELLIPTA) 200-25 MCG/INH  AEPB Inhale 1 puff into the lungs daily. 1 each 5  . gabapentin (NEURONTIN) 400 MG capsule Take 400 mg by mouth 2 (two) times daily.   2  . Na Sulfate-K Sulfate-Mg Sulf (SUPREP BOWEL PREP KIT) 17.5-3.13-1.6 GM/177ML SOLN Take 1 kit by mouth as directed. 354 mL 0  . nitroGLYCERIN (NITROSTAT) 0.4 MG SL tablet Place 1 tablet under the tongue every 5 (five) minutes as needed.    . traZODone (DESYREL) 50 MG tablet Take 50 mg by mouth at bedtime. Take 3 tablets    . vitamin B-12 (CYANOCOBALAMIN) 1000 MCG tablet Take 1,000 mcg by mouth daily.     Current Facility-Administered Medications  Medication Dose Route Frequency Provider Last Rate Last Dose  . influenza vac split quadrivalent PF (FLUARIX) injection 0.5 mL  0.5 mL Intramuscular Once Lequita Asal, MD        Review of Systems  Constitutional: Negative for chills, fever, malaise/fatigue and weight loss (up 2 pounds).       Feels "all right".  HENT: Negative for congestion, ear discharge, ear pain, hearing loss, nosebleeds, sinus pain, sore throat and tinnitus.   Eyes: Negative for blurred vision and double vision.  Respiratory: Positive for sputum production. Negative for cough, hemoptysis, shortness of breath and stridor.        COPD.  Cardiovascular: Positive for leg swelling. Negative for chest pain and palpitations.  Gastrointestinal: Negative for abdominal pain, blood in stool, constipation, diarrhea, melena, nausea and vomiting.       Interval colonoscopy.  Genitourinary: Negative for dysuria, frequency, hematuria and urgency.       Hx of hemorrhoids  Musculoskeletal: Positive for joint pain (arthritis- chronic; controlled). Negative for back pain, myalgias and neck pain.  Skin: Negative for itching and rash.  Neurological: Negative for dizziness, tingling, sensory change, focal weakness and headaches.  Endo/Heme/Allergies: Bruises/bleeds easily (chronic).  Psychiatric/Behavioral: Positive for depression (improved). Negative for  memory loss. The patient is not nervous/anxious and does not have insomnia.        Good mood.  All other systems reviewed and are negative.  Performance status (ECOG):  1  Vitals Blood pressure 121/74, pulse 72, temperature (!) 96 F (35.6 C), temperature source Tympanic, resp. rate 18, height '5\' 2"'  (1.575 m), weight 117 lb 9.9 oz (53.4 kg), SpO2 98 %.   Physical Exam  Constitutional: She is oriented to person, place, and time. She appears well-developed and well-nourished. No distress.  Thin built.  HENT:  Head: Normocephalic and atraumatic.  Mouth/Throat: Oropharynx is clear and moist. No oropharyngeal exudate.  Long brown hair.  Mask.  Eyes: Pupils are equal, round, and reactive to light. Conjunctivae and EOM are normal. No scleral icterus.  Blue eyes.  Neck: Normal range of motion. Neck supple. No JVD present.  Cardiovascular: Normal rate, regular rhythm and normal heart sounds. Exam reveals no gallop and no friction rub.  No murmur heard. Pulmonary/Chest: Effort normal. No respiratory distress. She has wheezes. She has no rales. She exhibits no tenderness.  Abdominal: Soft. Bowel sounds are normal. She exhibits no distension and no mass. There is no hepatosplenomegaly. There is no abdominal tenderness. There is no rebound and no guarding.  Musculoskeletal: Normal range of motion.        General: No tenderness or edema.     Comments: Clubbing.  Lymphadenopathy:    She has no cervical adenopathy.    She has no axillary adenopathy.       Right: No supraclavicular adenopathy present.       Left: No supraclavicular adenopathy present.  Neurological: She is alert and oriented to person, place, and time.  Skin: Skin is warm and dry. No rash noted. She is not diaphoretic. No erythema. No pallor.  Spider angioma.  No petechiae.  Psychiatric: She has a normal mood and affect. Her behavior is normal. Judgment and thought content normal.  Nursing note and vitals reviewed.    Appointment on 10/04/2018  Component Date Value Ref Range Status  . Sodium 10/04/2018 140  135 - 145 mmol/L Final  . Potassium 10/04/2018 3.5  3.5 - 5.1 mmol/L Final  . Chloride 10/04/2018 104  98 - 111 mmol/L Final  . CO2 10/04/2018 30  22 - 32 mmol/L Final  . Glucose, Bld 10/04/2018 120* 70 - 99 mg/dL Final  . BUN 10/04/2018 10  8 - 23 mg/dL Final  . Creatinine, Ser 10/04/2018 0.84  0.44 - 1.00 mg/dL Final  . Calcium 10/04/2018 9.0  8.9 - 10.3 mg/dL Final  . Total Protein 10/04/2018 6.6  6.5 - 8.1 g/dL Final  . Albumin 10/04/2018 3.8  3.5 - 5.0 g/dL Final  . AST 10/04/2018 18  15 - 41 U/L Final  . ALT 10/04/2018 15  0 - 44 U/L Final  . Alkaline Phosphatase 10/04/2018 80  38 - 126 U/L Final  . Total Bilirubin 10/04/2018 0.5  0.3 - 1.2 mg/dL Final  . GFR calc non Af Amer 10/04/2018 >60  >60 mL/min Final  . GFR calc Af Amer 10/04/2018 >60  >60 mL/min Final  . Anion gap 10/04/2018 6  5 - 15 Final   Performed at [facility], [address], [place], [place] [address]  . WBC 10/04/2018 9.6  4.0 - 10.5 K/uL Final  . RBC 10/04/2018 4.33  3.87 - 5.11 MIL/uL Final  . Hemoglobin 10/04/2018 13.0  12.0 - 15.0 g/dL Final  . HCT 10/04/2018 39.4  36.0 - 46.0 % Final  . MCV 10/04/2018 91.0  80.0 - 100.0 fL Final  . MCH 10/04/2018 30.0  26.0 - 34.0 pg Final  . MCHC 10/04/2018 33.0  30.0 - 36.0 g/dL Final  . RDW 10/04/2018 13.1  11.5 - 15.5 % Final  . Platelets 10/04/2018 51* 150 - 400 K/uL Final   Comment: Immature Platelet Fraction may be clinically indicated, consider ordering this additional test [id number]   . nRBC 10/04/2018 0.0  0.0 - 0.2 % Final  . Neutrophils Relative % 10/04/2018 71  % Final  . Neutro Abs 10/04/2018 6.9  1.7 - 7.7 K/uL Final  . Lymphocytes Relative 10/04/2018 17  % Final  . Lymphs Abs 10/04/2018 1.7  0.7 - 4.0 K/uL Final  . Monocytes Relative 10/04/2018 5  % Final  . Monocytes Absolute 10/04/2018 0.5  0.1 - 1.0 K/uL Final  . Eosinophils Relative  10/04/2018 5  % Final  . Eosinophils Absolute  10/04/2018 0.5  0.0 - 0.5 K/uL Final  . Basophils Relative 10/04/2018 1  % Final  . Basophils Absolute 10/04/2018 0.1  0.0 - 0.1 K/uL Final  . Immature Granulocytes 10/04/2018 1  % Final  . Abs Immature Granulocytes 10/04/2018 0.07  0.00 - 0.07 K/uL Final   Performed at [facility], [address], [place], [place] [address]    Assessment:  Nancy Love is a 68 y.o. 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  Platelet count has ranged from 39,000 - 65,000 over the past 3 years.  She was previously unresponsive to steroids and Rituxan.  Work up on 06/19/2015:  hematocrit 43.8, hemoglobin 14.4, MCV 93.1, platelets 30,000, WBC 9,500.  Ferritin was 52 with an iron saturation 19% and a TBIC 364. Folate was 9.4. Vitamin B-12 was 231 (low). LDH 168. Haptoglobin was 92. PTT 29.  ANA was positive (ribonucleic protein 1.2). M-spike was 0.  Work-up on 08/23/2018: Hematocrit 42.7, hemoglobin 13.9, MCV 91.6, platelets 39,000, WBC 9600.  B12 was 302 and folate 9.1.  Normal studies included:  TSH, hepatitis B core antibody total, hepatitis B surface antigen, hepatitis C antibody, and HIV testing.  Bone marrow biopsy on 07/21/2015 revealed no morphologic evidence of hematopoietic neoplasia.  Marrow was normocellular to slightly hypercellular for age (40-50%) with relative myeloid hyperplasia, mild nonspecific dyserythropoiesis and mildly atypical megakaryocytic hyperplasia.  There was patchy mild to focally moderate increase in reticulin.  Storage iron was present.  Flow cytometry revealed no abnormality.  Cytogenetics were normal (33, XX).  Alterations with were felt to correlate with an immune mediated thrombocytopenia with secondary/compensatory megakaryocytic hyperplasia and treatment effect.  Abdominal ultrasound on 08/24/2018 revealed no hepatomegaly or splenomegaly. There was a large benign-appearing cyst of the LEFT kidney.    She received Rituxan 08/24/2015 - 09/18/2015.  She was unresponsive with a platelet count of 78,000 pre-treatment, 42,000 post-treatment and 40,000 1 month later.    She has COPD.  Low dose chest CT on 03/23/2018 revealed lung-RADS 1, negative.  She has a family history of breast cancer (mother 81s and daughter 52s).  Bilateral mammogram on 05/24/2017 revealed no evidence of malignancy.   She has B12 deficiency.  B12 was 302 (low normal) on 08/23/2018.   She is on oral B12.  B12 was 739 on 10/04/2018.  Colonoscopy on 09/28/2018 with revealed multiple 2 to 10 mm polyps in the entire colon, removed with a hot snare. There were many 4 to 7 mm polyps in the entire colon, removed with a cold snare. There were many 1 to 4 mm polyps in the entire colon, removed with a cold biopsy forceps. There were many polyps treated with cautery.   Symptomatically, she denies any excess bruising or bleeding.  Platelet count is 51,000.  Plan: 1.   Labs today: CBC with diff, CMP, B12.  2.   Immune mediated thrombocytopenic purpura (ITP).  Clinically, she is doing well.  Platelet count is back to baseline.  Abdomen ultrasound on 08/24/2018 revealed no splenomegaly.  Review work-up performed on 08/23/2018.  Continue to monitor. 3.  Renal cyst  Patient asymptomatic.  Review ultrasound on 08/24/2018- left kidney cyst 7.6 cm and 44m right kidney cyst.             Consider re-imaging in 6-12 months to ensure stability. 4.  B12 deficiency             B12 was 231 (low) in 2017 and 302 on 08/23/2018.  She is on oral B12.  B12 level today is 739.  Continue to monitor. 5.  Family history of breast cancer             Patient has a family history of breast cancer             Last mammogram on 05/24/2017.  Patient needs yearly mammogram (overdue)  Follow-up with Dr Quay Burow. 6.   Multiple colon polyps  Interval colonoscopy reviewed.  Follow-up colonoscopy in 1 year. 7.   Low dose CT Lung Cancer Screening              Anticipate follow-up in 03/2019. 8.   Influenza vaccine today. 9.   RTC in 3 months for labs (CBC with diff, B12, folate). 10.   RTC in 6 months for MD assessment, labs (CBC with diff, +/- others).  I discussed the assessment and treatment plan with the patient.  The patient was provided an opportunity to ask questions and all were answered.  The patient agreed with the plan and demonstrated an understanding of the instructions.  The patient was advised to call back if the symptoms worsen or if the condition fails to improve as anticipated.  I provided 13 minutes of face-to-face time during this this encounter and > 50% was spent counseling as documented under my assessment and plan.    Lequita Asal, MD, PhD    10/04/2018, 3:24 PM  I, Selena Batten, am acting as scribe for [employer]. Mike Gip, MD, PhD.  I, Melissa C. Mike Gip, MD, have reviewed the above documentation for accuracy and completeness, and I agree with the above.

## 2018-10-03 ENCOUNTER — Encounter: Payer: Self-pay | Admitting: Gastroenterology

## 2018-10-04 ENCOUNTER — Inpatient Hospital Stay (HOSPITAL_BASED_OUTPATIENT_CLINIC_OR_DEPARTMENT_OTHER): Payer: Medicare Other | Admitting: Hematology and Oncology

## 2018-10-04 ENCOUNTER — Inpatient Hospital Stay: Payer: Medicare Other | Attending: Hematology and Oncology

## 2018-10-04 ENCOUNTER — Inpatient Hospital Stay: Payer: Medicare Other

## 2018-10-04 ENCOUNTER — Encounter: Payer: Self-pay | Admitting: Hematology and Oncology

## 2018-10-04 ENCOUNTER — Other Ambulatory Visit: Payer: Self-pay

## 2018-10-04 VITALS — BP 121/74 | HR 72 | Temp 96.0°F | Resp 18 | Ht 62.0 in | Wt 117.6 lb

## 2018-10-04 DIAGNOSIS — Z79899 Other long term (current) drug therapy: Secondary | ICD-10-CM | POA: Insufficient documentation

## 2018-10-04 DIAGNOSIS — Z87891 Personal history of nicotine dependence: Secondary | ICD-10-CM | POA: Insufficient documentation

## 2018-10-04 DIAGNOSIS — D693 Immune thrombocytopenic purpura: Secondary | ICD-10-CM

## 2018-10-04 DIAGNOSIS — J449 Chronic obstructive pulmonary disease, unspecified: Secondary | ICD-10-CM | POA: Insufficient documentation

## 2018-10-04 DIAGNOSIS — Z23 Encounter for immunization: Secondary | ICD-10-CM | POA: Insufficient documentation

## 2018-10-04 DIAGNOSIS — F329 Major depressive disorder, single episode, unspecified: Secondary | ICD-10-CM | POA: Insufficient documentation

## 2018-10-04 DIAGNOSIS — K635 Polyp of colon: Secondary | ICD-10-CM | POA: Diagnosis not present

## 2018-10-04 DIAGNOSIS — I10 Essential (primary) hypertension: Secondary | ICD-10-CM | POA: Diagnosis not present

## 2018-10-04 DIAGNOSIS — Z803 Family history of malignant neoplasm of breast: Secondary | ICD-10-CM | POA: Insufficient documentation

## 2018-10-04 DIAGNOSIS — N281 Cyst of kidney, acquired: Secondary | ICD-10-CM | POA: Insufficient documentation

## 2018-10-04 DIAGNOSIS — Z7951 Long term (current) use of inhaled steroids: Secondary | ICD-10-CM | POA: Diagnosis not present

## 2018-10-04 DIAGNOSIS — E538 Deficiency of other specified B group vitamins: Secondary | ICD-10-CM

## 2018-10-04 DIAGNOSIS — D696 Thrombocytopenia, unspecified: Secondary | ICD-10-CM | POA: Insufficient documentation

## 2018-10-04 LAB — COMPREHENSIVE METABOLIC PANEL
ALT: 15 U/L (ref 0–44)
AST: 18 U/L (ref 15–41)
Albumin: 3.8 g/dL (ref 3.5–5.0)
Alkaline Phosphatase: 80 U/L (ref 38–126)
Anion gap: 6 (ref 5–15)
BUN: 10 mg/dL (ref 8–23)
CO2: 30 mmol/L (ref 22–32)
Calcium: 9 mg/dL (ref 8.9–10.3)
Chloride: 104 mmol/L (ref 98–111)
Creatinine, Ser: 0.84 mg/dL (ref 0.44–1.00)
GFR calc Af Amer: >60 mL/min (ref >60)
GFR calc non Af Amer: >60 mL/min (ref >60)
Glucose, Bld: 120 mg/dL — ABNORMAL HIGH (ref 70–99)
Potassium: 3.5 mmol/L (ref 3.5–5.1)
Sodium: 140 mmol/L (ref 135–145)
Total Bilirubin: 0.5 mg/dL (ref 0.3–1.2)
Total Protein: 6.6 g/dL (ref 6.5–8.1)

## 2018-10-04 LAB — CBC WITH DIFFERENTIAL/PLATELET
Abs Immature Granulocytes: 0.07 10*3/uL (ref 0.00–0.07)
Basophils Absolute: 0.1 10*3/uL (ref 0.0–0.1)
Basophils Relative: 1 %
Eosinophils Absolute: 0.5 10*3/uL (ref 0.0–0.5)
Eosinophils Relative: 5 %
HCT: 39.4 % (ref 36.0–46.0)
Hemoglobin: 13 g/dL (ref 12.0–15.0)
Immature Granulocytes: 1 %
Lymphocytes Relative: 17 %
Lymphs Abs: 1.7 10*3/uL (ref 0.7–4.0)
MCH: 30 pg (ref 26.0–34.0)
MCHC: 33 g/dL (ref 30.0–36.0)
MCV: 91 fL (ref 80.0–100.0)
Monocytes Absolute: 0.5 10*3/uL (ref 0.1–1.0)
Monocytes Relative: 5 %
Neutro Abs: 6.9 10*3/uL (ref 1.7–7.7)
Neutrophils Relative %: 71 %
Platelets: 51 10*3/uL — ABNORMAL LOW (ref 150–400)
RBC: 4.33 MIL/uL (ref 3.87–5.11)
RDW: 13.1 % (ref 11.5–15.5)
WBC: 9.6 10*3/uL (ref 4.0–10.5)
nRBC: 0 % (ref 0.0–0.2)

## 2018-10-04 LAB — VITAMIN B12: Vitamin B-12: 739 pg/mL (ref 180–914)

## 2018-10-04 MED ORDER — INFLUENZA VAC SPLIT QUAD 0.5 ML IM SUSY
0.5000 mL | PREFILLED_SYRINGE | Freq: Once | INTRAMUSCULAR | Status: AC
  Administered 2018-10-04: 0.5 mL via INTRAMUSCULAR

## 2018-10-15 ENCOUNTER — Telehealth: Payer: Self-pay

## 2018-10-15 NOTE — Telephone Encounter (Signed)
Spoke with the patient to inform her of the B-12 results. The patient wanted to know should she continue to take B-12 at this time. I have inform her yes to continue to take B-12 until Dr Mike Gip inform her to stop. The patient was understanding.

## 2018-10-26 ENCOUNTER — Encounter: Payer: Self-pay | Admitting: Gastroenterology

## 2018-12-12 ENCOUNTER — Other Ambulatory Visit: Payer: Self-pay | Admitting: Internal Medicine

## 2018-12-12 DIAGNOSIS — Z1231 Encounter for screening mammogram for malignant neoplasm of breast: Secondary | ICD-10-CM

## 2018-12-12 DIAGNOSIS — Z13820 Encounter for screening for osteoporosis: Secondary | ICD-10-CM

## 2018-12-27 ENCOUNTER — Other Ambulatory Visit: Payer: Medicare Other

## 2019-03-22 ENCOUNTER — Telehealth: Payer: Self-pay | Admitting: *Deleted

## 2019-03-22 NOTE — Telephone Encounter (Signed)
(  03/22/19) Left message for pt to notify them that it is time to schedule annual low dose lung cancer screening CT scan. Instructed patient to call back to verify information prior to the scan being scheduled SRW

## 2019-04-02 ENCOUNTER — Telehealth: Payer: Self-pay | Admitting: *Deleted

## 2019-04-02 NOTE — Telephone Encounter (Signed)
(  04/02/19) Left message for pt to notify them that it is time to schedule annual low dose lung cancer screening CT scan. Instructed patient to call back to verify information prior to the scan being scheduled °SRW °  ° ° ° °

## 2019-04-04 ENCOUNTER — Inpatient Hospital Stay: Payer: Medicare Other | Admitting: Hematology and Oncology

## 2019-04-04 ENCOUNTER — Inpatient Hospital Stay: Payer: Medicare Other

## 2019-04-13 ENCOUNTER — Telehealth: Payer: Self-pay

## 2019-04-13 NOTE — Telephone Encounter (Signed)
Patient has moved to [place] an will no longer be getting lung cancer CT screening in [place].

## 2021-06-15 IMAGING — US ULTRASOUND ABDOMEN COMPLETE
1 series · 14 of 25 positions shown · non-contrast
Comparison: Chest CT March 23, 2018

CLINICAL DATA: Thrombocytopenia.

EXAM:
ABDOMEN ULTRASOUND COMPLETE

[Series 1: ultrasound abdomen complete · 0.15mm/px · 14 of 83 slices shown]
[im 1/83]
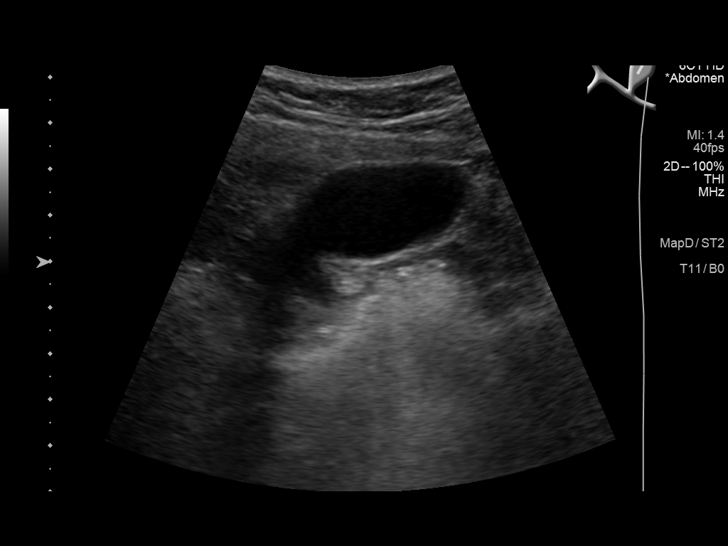
[im 7/83]
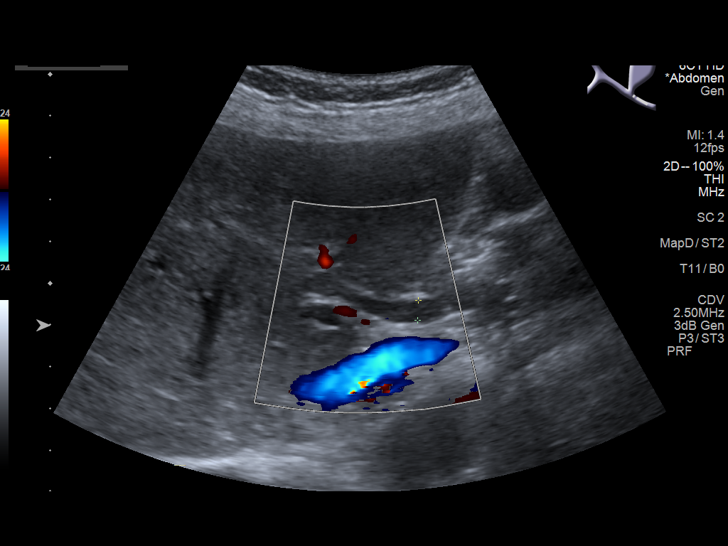
[im 14/83]
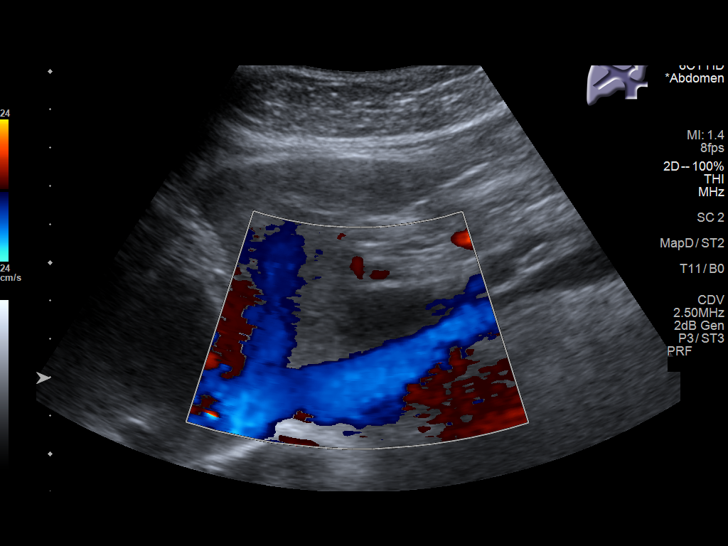
[im 21/83]
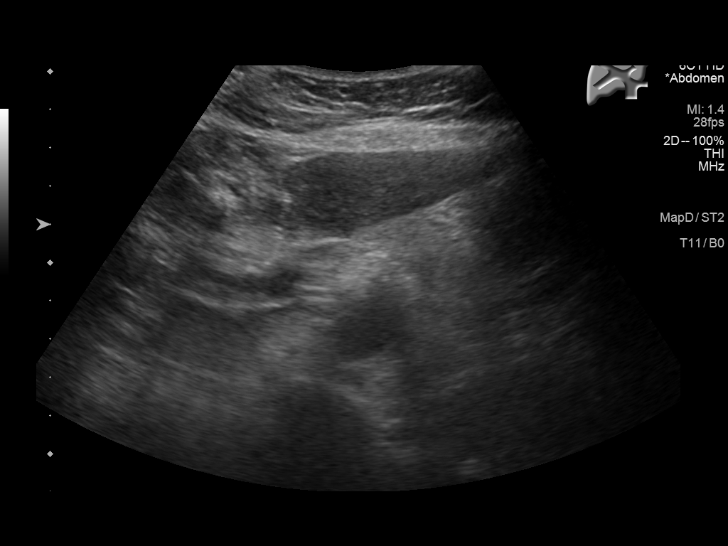
[im 28/83]
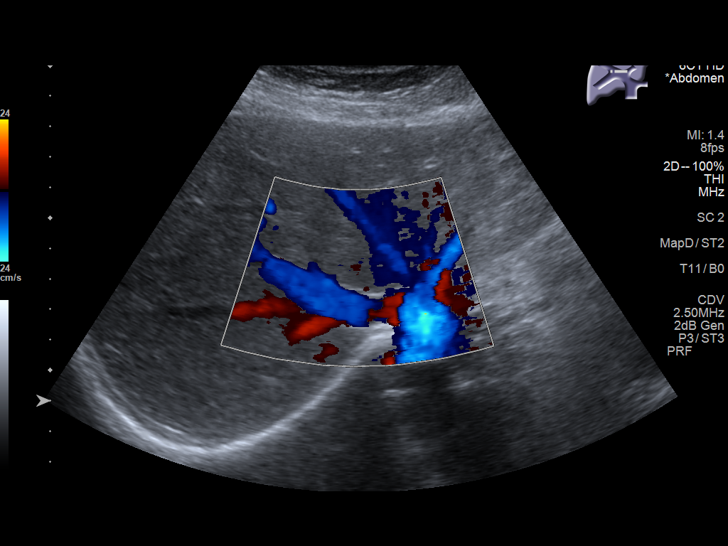
[im 31/83]
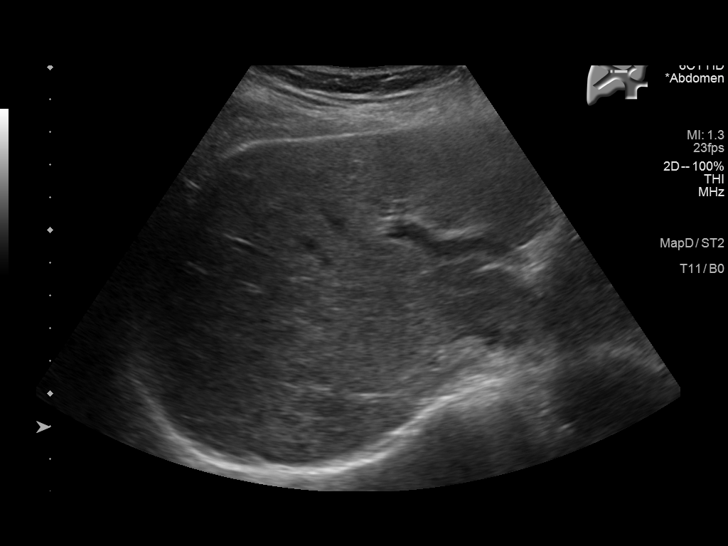
[im 38/83]
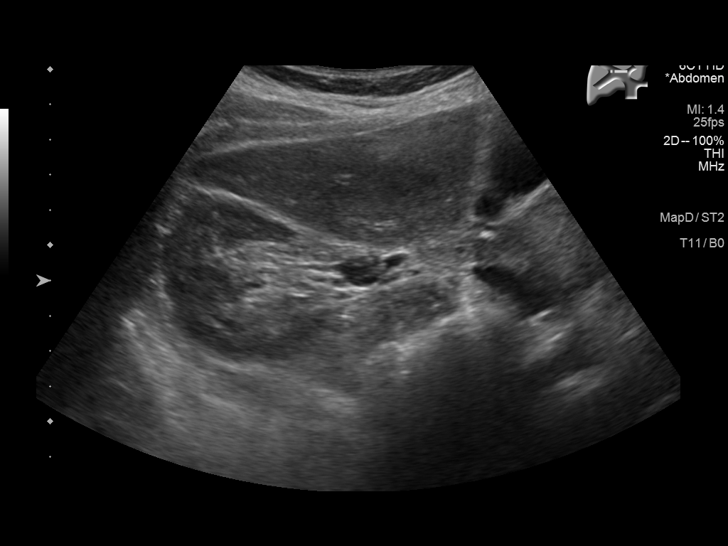
[im 45/83]
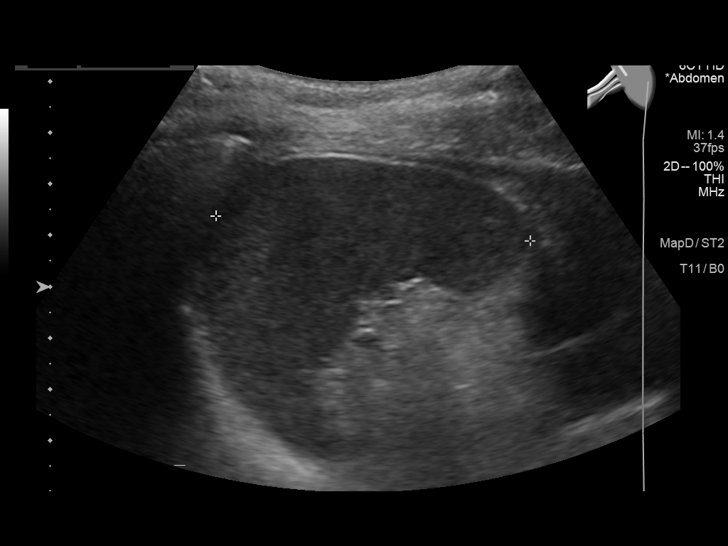
[im 52/83]
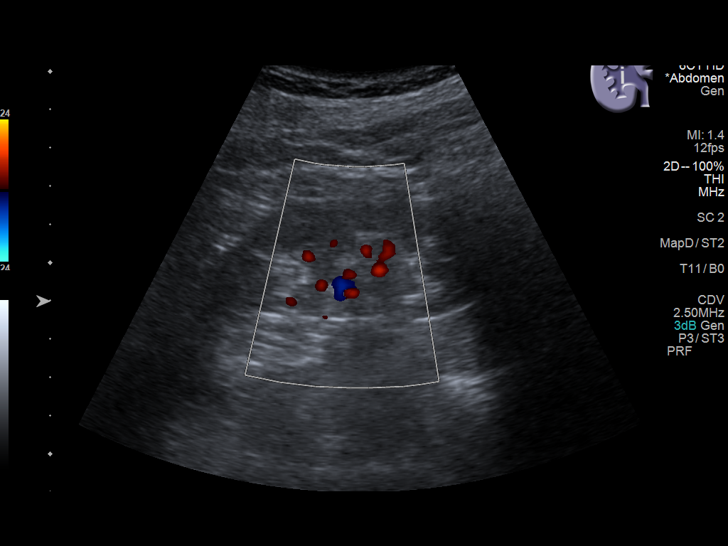
[im 55/83]
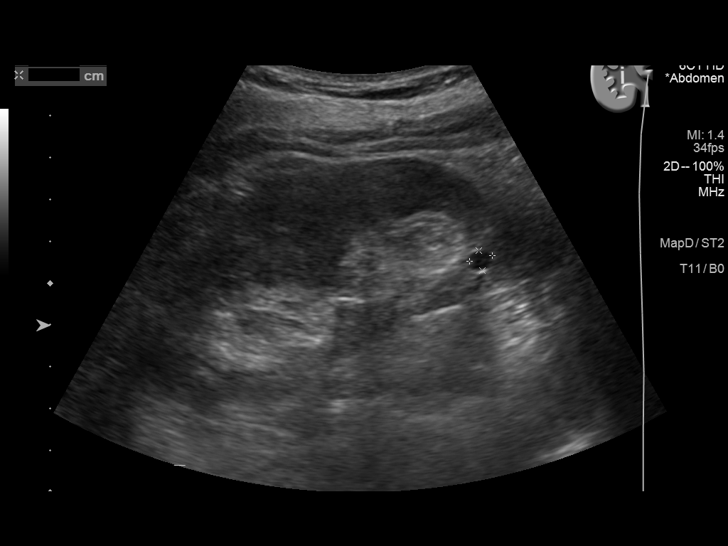
[im 62/83]
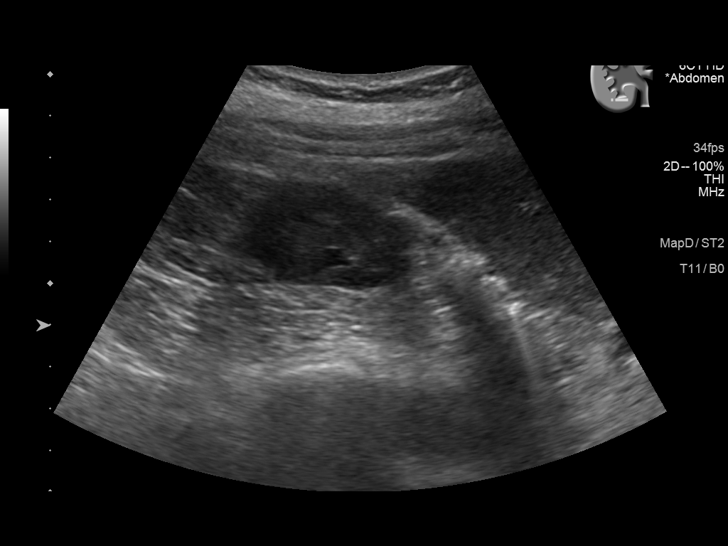
[im 69/83]
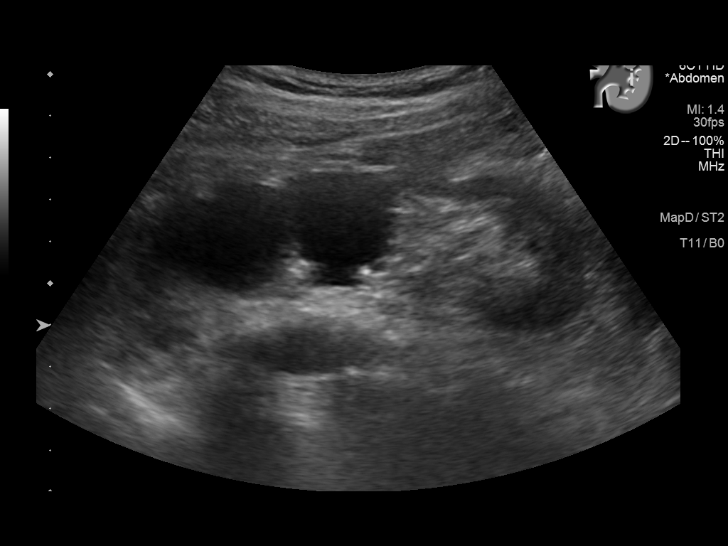
[im 76/83]
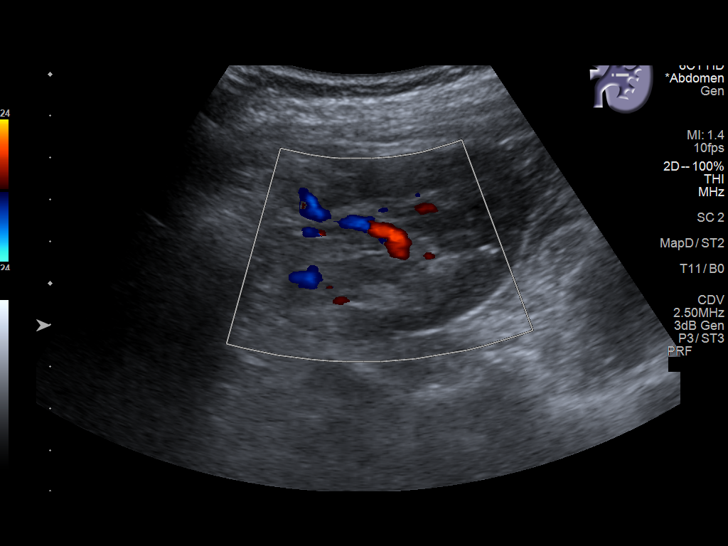
[im 83/83]
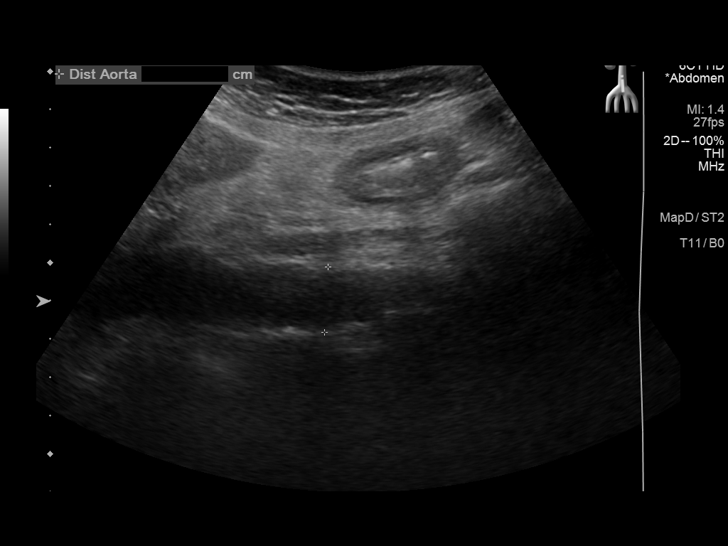

[14 of 25 positions shown; findings below may reference images not displayed]

FINDINGS: Gallbladder: No gallstones or wall thickening visualized. No
sonographic Murphy sign noted by sonographer.

Common bile duct: Diameter: Normal at 5 mm

Liver: No focal lesion identified. Within normal limits in
parenchymal echogenicity. Portal vein is patent on color Doppler
imaging with normal direction of blood flow towards the liver.

IVC: No abnormality visualized.

Pancreas: Visualized portion unremarkable.

Spleen: Size and appearance within normal limits. Spleen measures
6.1 cm

Right Kidney: Length: 12.1 cm. No hydronephrosis. Small anechoic
cyst measuring 6 mm.

Left Kidney: Length: 11.7 cm. Cyst of the LEFT kidney measures
cm. No hydronephrosis

Abdominal aorta: No aneurysm visualized.

Other findings: None.
IMPRESSION: 1. No hepatomegaly or splenomegaly.
2. Large benign-appearing cyst of the LEFT kidney.

## 2022-01-10 DEATH — deceased
